# Patient Record
Sex: Male | Born: 1945 | Race: Black or African American | Hispanic: No | Marital: Married | Smoking: Former smoker
Health system: Southern US, Community
[De-identification: ages and names within clinical notes are randomized; demographics above are authoritative.]

## PROBLEM LIST (undated history)

## (undated) DIAGNOSIS — K219 Gastro-esophageal reflux disease without esophagitis: Secondary | ICD-10-CM

## (undated) DIAGNOSIS — R9431 Abnormal electrocardiogram [ECG] [EKG]: Secondary | ICD-10-CM

## (undated) DIAGNOSIS — H40051 Ocular hypertension, right eye: Secondary | ICD-10-CM

## (undated) DIAGNOSIS — E119 Type 2 diabetes mellitus without complications: Secondary | ICD-10-CM

## (undated) DIAGNOSIS — B351 Tinea unguium: Secondary | ICD-10-CM

## (undated) DIAGNOSIS — I10 Essential (primary) hypertension: Secondary | ICD-10-CM

## (undated) DIAGNOSIS — J309 Allergic rhinitis, unspecified: Secondary | ICD-10-CM

## (undated) DIAGNOSIS — D509 Iron deficiency anemia, unspecified: Secondary | ICD-10-CM

## (undated) DIAGNOSIS — N529 Male erectile dysfunction, unspecified: Secondary | ICD-10-CM

## (undated) DIAGNOSIS — H3321 Serous retinal detachment, right eye: Secondary | ICD-10-CM

## (undated) HISTORY — PX: OTHER SURGICAL HISTORY: SHX169

## (undated) HISTORY — DX: Serous retinal detachment, right eye: H33.21

## (undated) HISTORY — DX: Essential (primary) hypertension: I10

## (undated) HISTORY — DX: Abnormal electrocardiogram (ECG) (EKG): R94.31

## (undated) HISTORY — DX: Gastro-esophageal reflux disease without esophagitis: K21.9

## (undated) HISTORY — DX: Type 2 diabetes mellitus without complications: E11.9

## (undated) HISTORY — DX: Allergic rhinitis, unspecified: J30.9

## (undated) HISTORY — DX: Male erectile dysfunction, unspecified: N52.9

## (undated) HISTORY — PX: APPENDECTOMY: SHX54

## (undated) HISTORY — DX: Tinea unguium: B35.1

## (undated) HISTORY — DX: Ocular hypertension, right eye: H40.051

## (undated) HISTORY — DX: Iron deficiency anemia, unspecified: D50.9

## (undated) HISTORY — PX: CATARACT EXTRACTION: SUR2

---

## 1998-04-11 ENCOUNTER — Encounter: Admission: RE | Admit: 1998-04-11 | Discharge: 1998-07-10 | Payer: Self-pay | Admitting: *Deleted

## 2011-03-28 DIAGNOSIS — Z961 Presence of intraocular lens: Secondary | ICD-10-CM | POA: Insufficient documentation

## 2011-03-28 DIAGNOSIS — H35341 Macular cyst, hole, or pseudohole, right eye: Secondary | ICD-10-CM | POA: Insufficient documentation

## 2011-03-28 DIAGNOSIS — H33021 Retinal detachment with multiple breaks, right eye: Secondary | ICD-10-CM | POA: Insufficient documentation

## 2011-03-28 DIAGNOSIS — H33312 Horseshoe tear of retina without detachment, left eye: Secondary | ICD-10-CM | POA: Insufficient documentation

## 2011-12-20 DIAGNOSIS — I10 Essential (primary) hypertension: Secondary | ICD-10-CM | POA: Diagnosis not present

## 2011-12-20 DIAGNOSIS — Z0000 Encounter for general adult medical examination without abnormal findings: Secondary | ICD-10-CM | POA: Diagnosis not present

## 2011-12-20 DIAGNOSIS — E1142 Type 2 diabetes mellitus with diabetic polyneuropathy: Secondary | ICD-10-CM | POA: Diagnosis not present

## 2011-12-20 DIAGNOSIS — E1149 Type 2 diabetes mellitus with other diabetic neurological complication: Secondary | ICD-10-CM | POA: Diagnosis not present

## 2012-03-30 DIAGNOSIS — J019 Acute sinusitis, unspecified: Secondary | ICD-10-CM | POA: Diagnosis not present

## 2012-04-09 DIAGNOSIS — Z961 Presence of intraocular lens: Secondary | ICD-10-CM | POA: Diagnosis not present

## 2012-04-09 DIAGNOSIS — H33319 Horseshoe tear of retina without detachment, unspecified eye: Secondary | ICD-10-CM | POA: Diagnosis not present

## 2012-04-09 DIAGNOSIS — H33029 Retinal detachment with multiple breaks, unspecified eye: Secondary | ICD-10-CM | POA: Diagnosis not present

## 2012-04-09 DIAGNOSIS — H2510 Age-related nuclear cataract, unspecified eye: Secondary | ICD-10-CM | POA: Diagnosis not present

## 2012-04-09 DIAGNOSIS — H35349 Macular cyst, hole, or pseudohole, unspecified eye: Secondary | ICD-10-CM | POA: Diagnosis not present

## 2012-04-21 DIAGNOSIS — I10 Essential (primary) hypertension: Secondary | ICD-10-CM | POA: Diagnosis not present

## 2012-04-21 DIAGNOSIS — E1149 Type 2 diabetes mellitus with other diabetic neurological complication: Secondary | ICD-10-CM | POA: Diagnosis not present

## 2012-04-21 DIAGNOSIS — E1142 Type 2 diabetes mellitus with diabetic polyneuropathy: Secondary | ICD-10-CM | POA: Diagnosis not present

## 2012-05-04 DIAGNOSIS — E1149 Type 2 diabetes mellitus with other diabetic neurological complication: Secondary | ICD-10-CM | POA: Diagnosis not present

## 2012-08-25 DIAGNOSIS — I10 Essential (primary) hypertension: Secondary | ICD-10-CM | POA: Diagnosis not present

## 2012-08-25 DIAGNOSIS — E1142 Type 2 diabetes mellitus with diabetic polyneuropathy: Secondary | ICD-10-CM | POA: Diagnosis not present

## 2012-08-25 DIAGNOSIS — E1149 Type 2 diabetes mellitus with other diabetic neurological complication: Secondary | ICD-10-CM | POA: Diagnosis not present

## 2012-11-10 DIAGNOSIS — Z7189 Other specified counseling: Secondary | ICD-10-CM | POA: Diagnosis not present

## 2012-11-10 DIAGNOSIS — Z23 Encounter for immunization: Secondary | ICD-10-CM | POA: Diagnosis not present

## 2013-01-09 DIAGNOSIS — S60229A Contusion of unspecified hand, initial encounter: Secondary | ICD-10-CM | POA: Diagnosis not present

## 2013-01-09 DIAGNOSIS — S62319A Displaced fracture of base of unspecified metacarpal bone, initial encounter for closed fracture: Secondary | ICD-10-CM | POA: Diagnosis not present

## 2013-01-13 DIAGNOSIS — S62339A Displaced fracture of neck of unspecified metacarpal bone, initial encounter for closed fracture: Secondary | ICD-10-CM | POA: Diagnosis not present

## 2013-01-14 DIAGNOSIS — H35349 Macular cyst, hole, or pseudohole, unspecified eye: Secondary | ICD-10-CM | POA: Diagnosis not present

## 2013-01-14 DIAGNOSIS — H33029 Retinal detachment with multiple breaks, unspecified eye: Secondary | ICD-10-CM | POA: Diagnosis not present

## 2013-01-14 DIAGNOSIS — H33319 Horseshoe tear of retina without detachment, unspecified eye: Secondary | ICD-10-CM | POA: Diagnosis not present

## 2013-01-14 DIAGNOSIS — H2510 Age-related nuclear cataract, unspecified eye: Secondary | ICD-10-CM | POA: Diagnosis not present

## 2013-01-14 DIAGNOSIS — Z961 Presence of intraocular lens: Secondary | ICD-10-CM | POA: Diagnosis not present

## 2013-01-22 DIAGNOSIS — Z0000 Encounter for general adult medical examination without abnormal findings: Secondary | ICD-10-CM | POA: Diagnosis not present

## 2013-01-22 DIAGNOSIS — E1142 Type 2 diabetes mellitus with diabetic polyneuropathy: Secondary | ICD-10-CM | POA: Diagnosis not present

## 2013-01-22 DIAGNOSIS — Z1331 Encounter for screening for depression: Secondary | ICD-10-CM | POA: Diagnosis not present

## 2013-01-22 DIAGNOSIS — E1149 Type 2 diabetes mellitus with other diabetic neurological complication: Secondary | ICD-10-CM | POA: Diagnosis not present

## 2013-01-22 DIAGNOSIS — Z23 Encounter for immunization: Secondary | ICD-10-CM | POA: Diagnosis not present

## 2013-01-22 DIAGNOSIS — I10 Essential (primary) hypertension: Secondary | ICD-10-CM | POA: Diagnosis not present

## 2013-01-22 DIAGNOSIS — Z125 Encounter for screening for malignant neoplasm of prostate: Secondary | ICD-10-CM | POA: Diagnosis not present

## 2013-02-03 DIAGNOSIS — S62339A Displaced fracture of neck of unspecified metacarpal bone, initial encounter for closed fracture: Secondary | ICD-10-CM | POA: Diagnosis not present

## 2013-03-29 ENCOUNTER — Ambulatory Visit: Payer: Self-pay | Admitting: Podiatry

## 2013-04-26 ENCOUNTER — Ambulatory Visit: Payer: Self-pay | Admitting: Podiatry

## 2013-05-25 DIAGNOSIS — I10 Essential (primary) hypertension: Secondary | ICD-10-CM | POA: Diagnosis not present

## 2013-05-25 DIAGNOSIS — E1149 Type 2 diabetes mellitus with other diabetic neurological complication: Secondary | ICD-10-CM | POA: Diagnosis not present

## 2013-05-25 DIAGNOSIS — E1142 Type 2 diabetes mellitus with diabetic polyneuropathy: Secondary | ICD-10-CM | POA: Diagnosis not present

## 2013-09-27 DIAGNOSIS — I10 Essential (primary) hypertension: Secondary | ICD-10-CM | POA: Diagnosis not present

## 2013-09-27 DIAGNOSIS — E1142 Type 2 diabetes mellitus with diabetic polyneuropathy: Secondary | ICD-10-CM | POA: Diagnosis not present

## 2013-09-27 DIAGNOSIS — Z23 Encounter for immunization: Secondary | ICD-10-CM | POA: Diagnosis not present

## 2013-09-27 DIAGNOSIS — E1149 Type 2 diabetes mellitus with other diabetic neurological complication: Secondary | ICD-10-CM | POA: Diagnosis not present

## 2013-11-23 DIAGNOSIS — S161XXA Strain of muscle, fascia and tendon at neck level, initial encounter: Secondary | ICD-10-CM | POA: Diagnosis not present

## 2013-11-23 DIAGNOSIS — T148 Other injury of unspecified body region: Secondary | ICD-10-CM | POA: Diagnosis not present

## 2013-12-21 DIAGNOSIS — E1142 Type 2 diabetes mellitus with diabetic polyneuropathy: Secondary | ICD-10-CM | POA: Diagnosis not present

## 2013-12-21 DIAGNOSIS — M25562 Pain in left knee: Secondary | ICD-10-CM | POA: Diagnosis not present

## 2013-12-21 DIAGNOSIS — M25551 Pain in right hip: Secondary | ICD-10-CM | POA: Diagnosis not present

## 2013-12-21 DIAGNOSIS — I10 Essential (primary) hypertension: Secondary | ICD-10-CM | POA: Diagnosis not present

## 2013-12-22 ENCOUNTER — Other Ambulatory Visit: Payer: Self-pay | Admitting: Internal Medicine

## 2013-12-22 ENCOUNTER — Ambulatory Visit
Admission: RE | Admit: 2013-12-22 | Discharge: 2013-12-22 | Disposition: A | Discharge status: Home / Self Care | Payer: Medicare Other | Source: Ambulatory Visit | Attending: Internal Medicine | Admitting: Internal Medicine

## 2013-12-22 DIAGNOSIS — M25551 Pain in right hip: Secondary | ICD-10-CM

## 2013-12-22 DIAGNOSIS — M1712 Unilateral primary osteoarthritis, left knee: Secondary | ICD-10-CM | POA: Diagnosis not present

## 2013-12-23 DIAGNOSIS — Z961 Presence of intraocular lens: Secondary | ICD-10-CM | POA: Diagnosis not present

## 2013-12-23 DIAGNOSIS — H35341 Macular cyst, hole, or pseudohole, right eye: Secondary | ICD-10-CM | POA: Diagnosis not present

## 2013-12-23 DIAGNOSIS — H33021 Retinal detachment with multiple breaks, right eye: Secondary | ICD-10-CM | POA: Diagnosis not present

## 2013-12-23 DIAGNOSIS — H33312 Horseshoe tear of retina without detachment, left eye: Secondary | ICD-10-CM | POA: Diagnosis not present

## 2013-12-23 DIAGNOSIS — H2512 Age-related nuclear cataract, left eye: Secondary | ICD-10-CM | POA: Diagnosis not present

## 2014-04-26 DIAGNOSIS — E1142 Type 2 diabetes mellitus with diabetic polyneuropathy: Secondary | ICD-10-CM | POA: Diagnosis not present

## 2014-04-26 DIAGNOSIS — I10 Essential (primary) hypertension: Secondary | ICD-10-CM | POA: Diagnosis not present

## 2014-09-06 DIAGNOSIS — E1142 Type 2 diabetes mellitus with diabetic polyneuropathy: Secondary | ICD-10-CM | POA: Diagnosis not present

## 2014-09-06 DIAGNOSIS — Z23 Encounter for immunization: Secondary | ICD-10-CM | POA: Diagnosis not present

## 2014-09-06 DIAGNOSIS — Z794 Long term (current) use of insulin: Secondary | ICD-10-CM | POA: Diagnosis not present

## 2014-09-06 DIAGNOSIS — I10 Essential (primary) hypertension: Secondary | ICD-10-CM | POA: Diagnosis not present

## 2014-10-27 DIAGNOSIS — H35341 Macular cyst, hole, or pseudohole, right eye: Secondary | ICD-10-CM | POA: Diagnosis not present

## 2014-10-27 DIAGNOSIS — Z961 Presence of intraocular lens: Secondary | ICD-10-CM | POA: Diagnosis not present

## 2014-10-27 DIAGNOSIS — H33021 Retinal detachment with multiple breaks, right eye: Secondary | ICD-10-CM | POA: Diagnosis not present

## 2014-10-27 DIAGNOSIS — H2512 Age-related nuclear cataract, left eye: Secondary | ICD-10-CM | POA: Diagnosis not present

## 2014-10-27 DIAGNOSIS — H33312 Horseshoe tear of retina without detachment, left eye: Secondary | ICD-10-CM | POA: Diagnosis not present

## 2015-01-26 DIAGNOSIS — E1165 Type 2 diabetes mellitus with hyperglycemia: Secondary | ICD-10-CM | POA: Diagnosis not present

## 2015-01-26 DIAGNOSIS — Z125 Encounter for screening for malignant neoplasm of prostate: Secondary | ICD-10-CM | POA: Diagnosis not present

## 2015-01-26 DIAGNOSIS — Z1389 Encounter for screening for other disorder: Secondary | ICD-10-CM | POA: Diagnosis not present

## 2015-01-26 DIAGNOSIS — Z794 Long term (current) use of insulin: Secondary | ICD-10-CM | POA: Diagnosis not present

## 2015-01-26 DIAGNOSIS — E0842 Diabetes mellitus due to underlying condition with diabetic polyneuropathy: Secondary | ICD-10-CM | POA: Diagnosis not present

## 2015-01-26 DIAGNOSIS — Z7984 Long term (current) use of oral hypoglycemic drugs: Secondary | ICD-10-CM | POA: Diagnosis not present

## 2015-01-26 DIAGNOSIS — Z0000 Encounter for general adult medical examination without abnormal findings: Secondary | ICD-10-CM | POA: Diagnosis not present

## 2015-01-26 DIAGNOSIS — I10 Essential (primary) hypertension: Secondary | ICD-10-CM | POA: Diagnosis not present

## 2015-02-04 IMAGING — CR DG KNEE 1-2V*L*
2 series · 2 of 2 positions shown · non-contrast
Comparison: None.

CLINICAL DATA: Medial knee pain for several weeks.

EXAM:
LEFT KNEE - 1-2 VIEW

[t knee ap left]
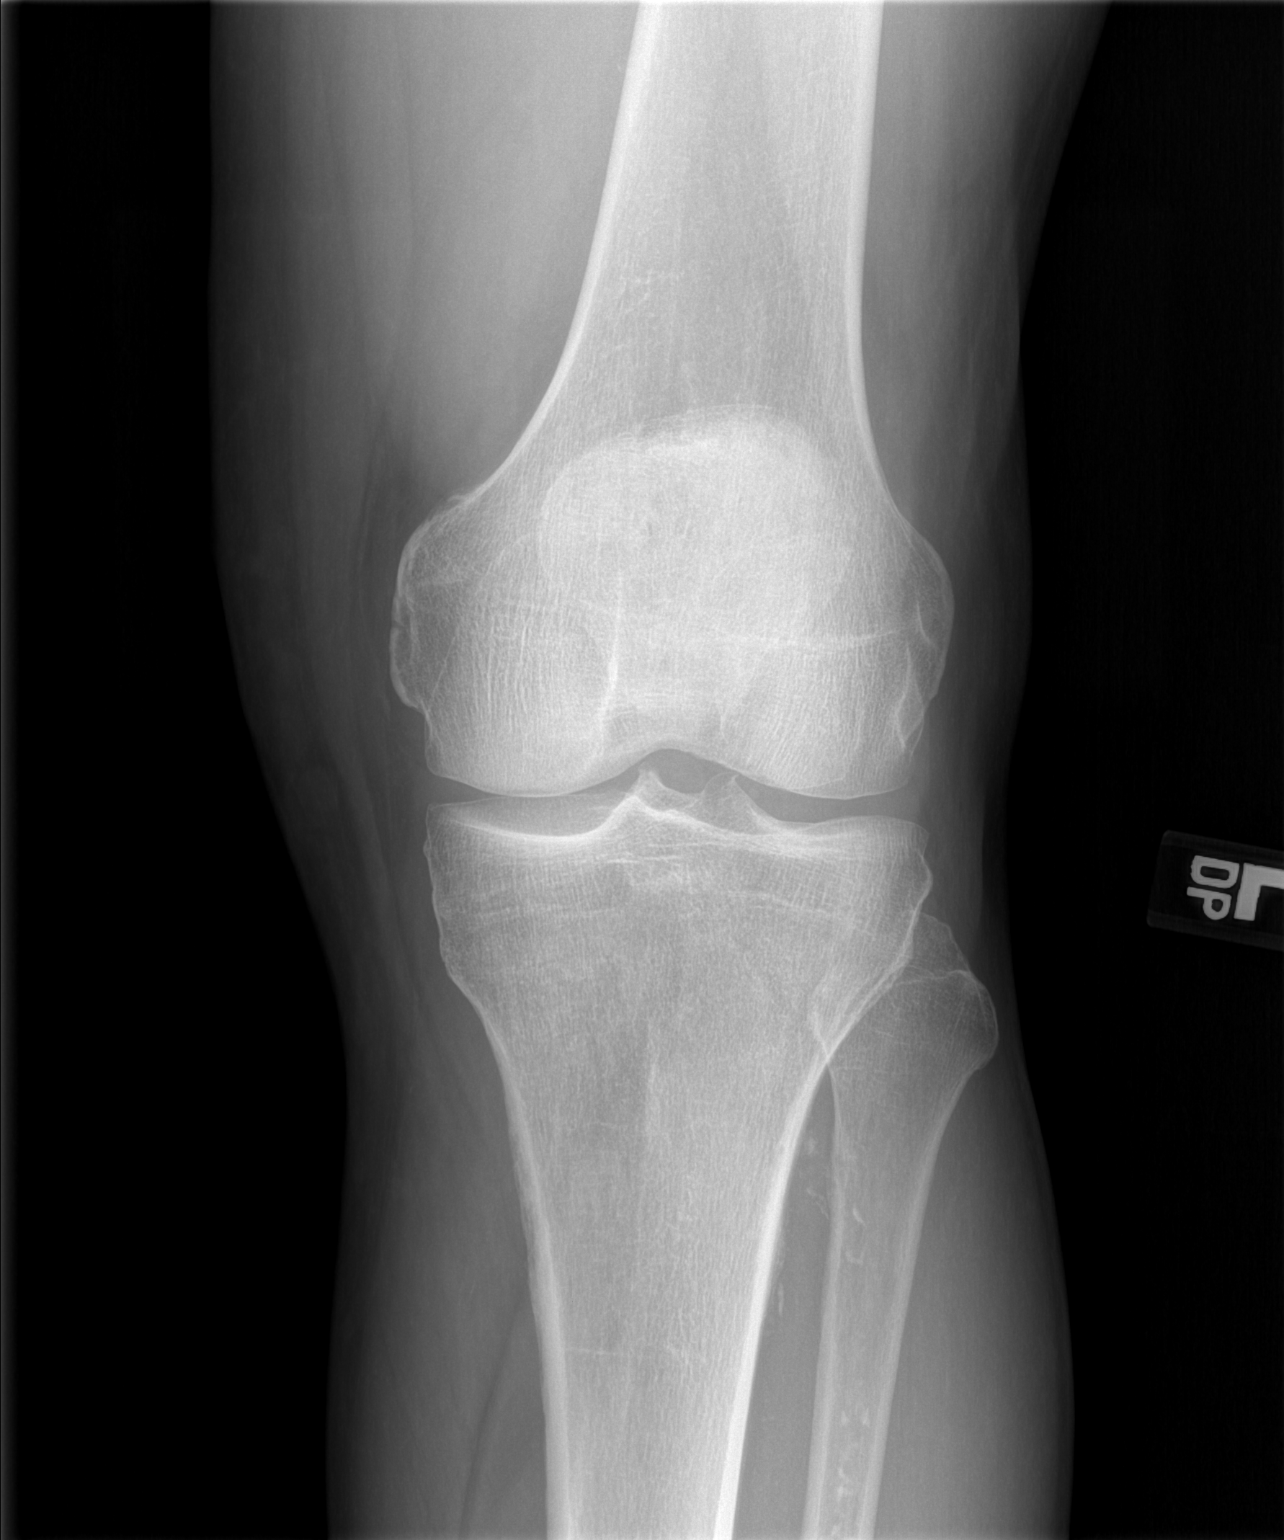

[t knee lat left]
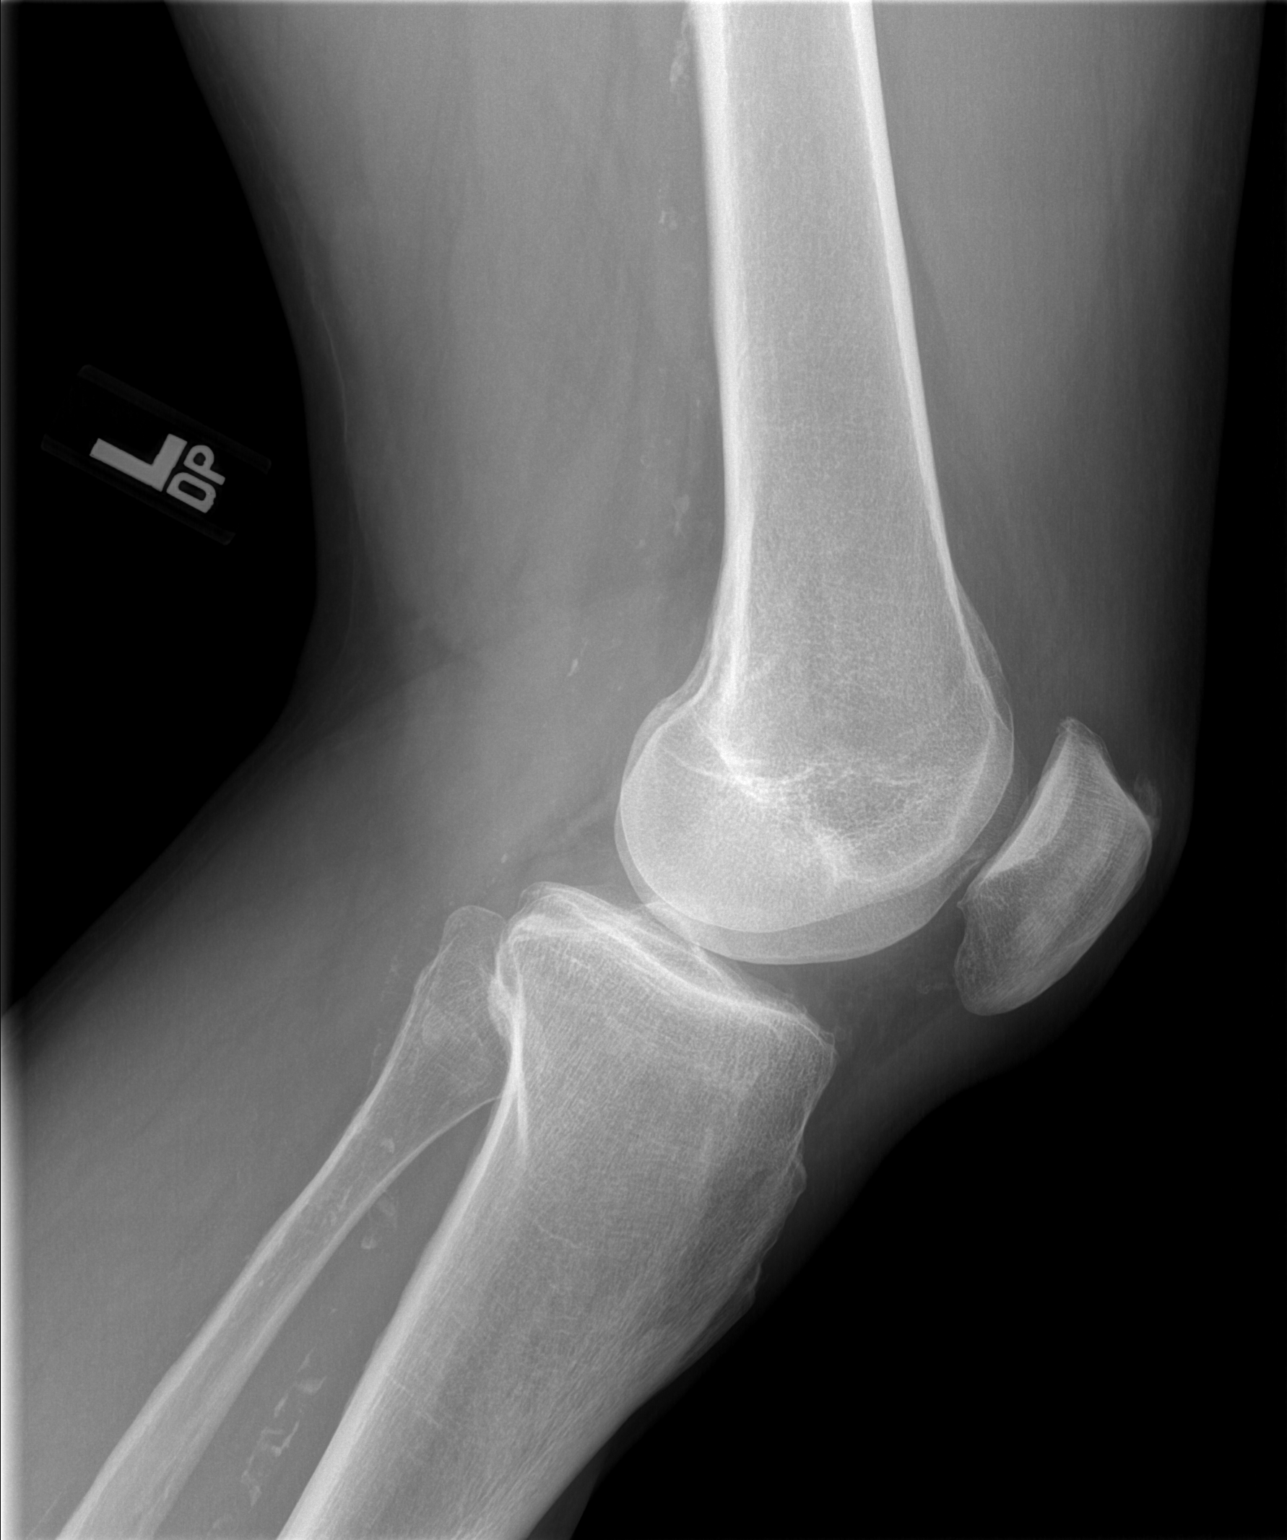

[2 of 2 positions shown; findings below may reference images not displayed]

FINDINGS: Early spurring within the patellofemoral compartment. Joint spaces
relatively maintained. No acute bony abnormality. Specifically, no
fracture, subluxation, or dislocation. Soft tissues are intact. No
joint effusion. Scattered vascular calcifications.
IMPRESSION: Early degenerative changes.  No acute findings.

## 2015-02-04 IMAGING — CR DG HIP COMPLETE 2+V*R*
2 series · 2 of 2 positions shown · non-contrast
Comparison: None.

CLINICAL DATA: Pain for 1 week.  No known injury.

EXAM:
RIGHT HIP - COMPLETE 2+ VIEW

[t hip ap right]
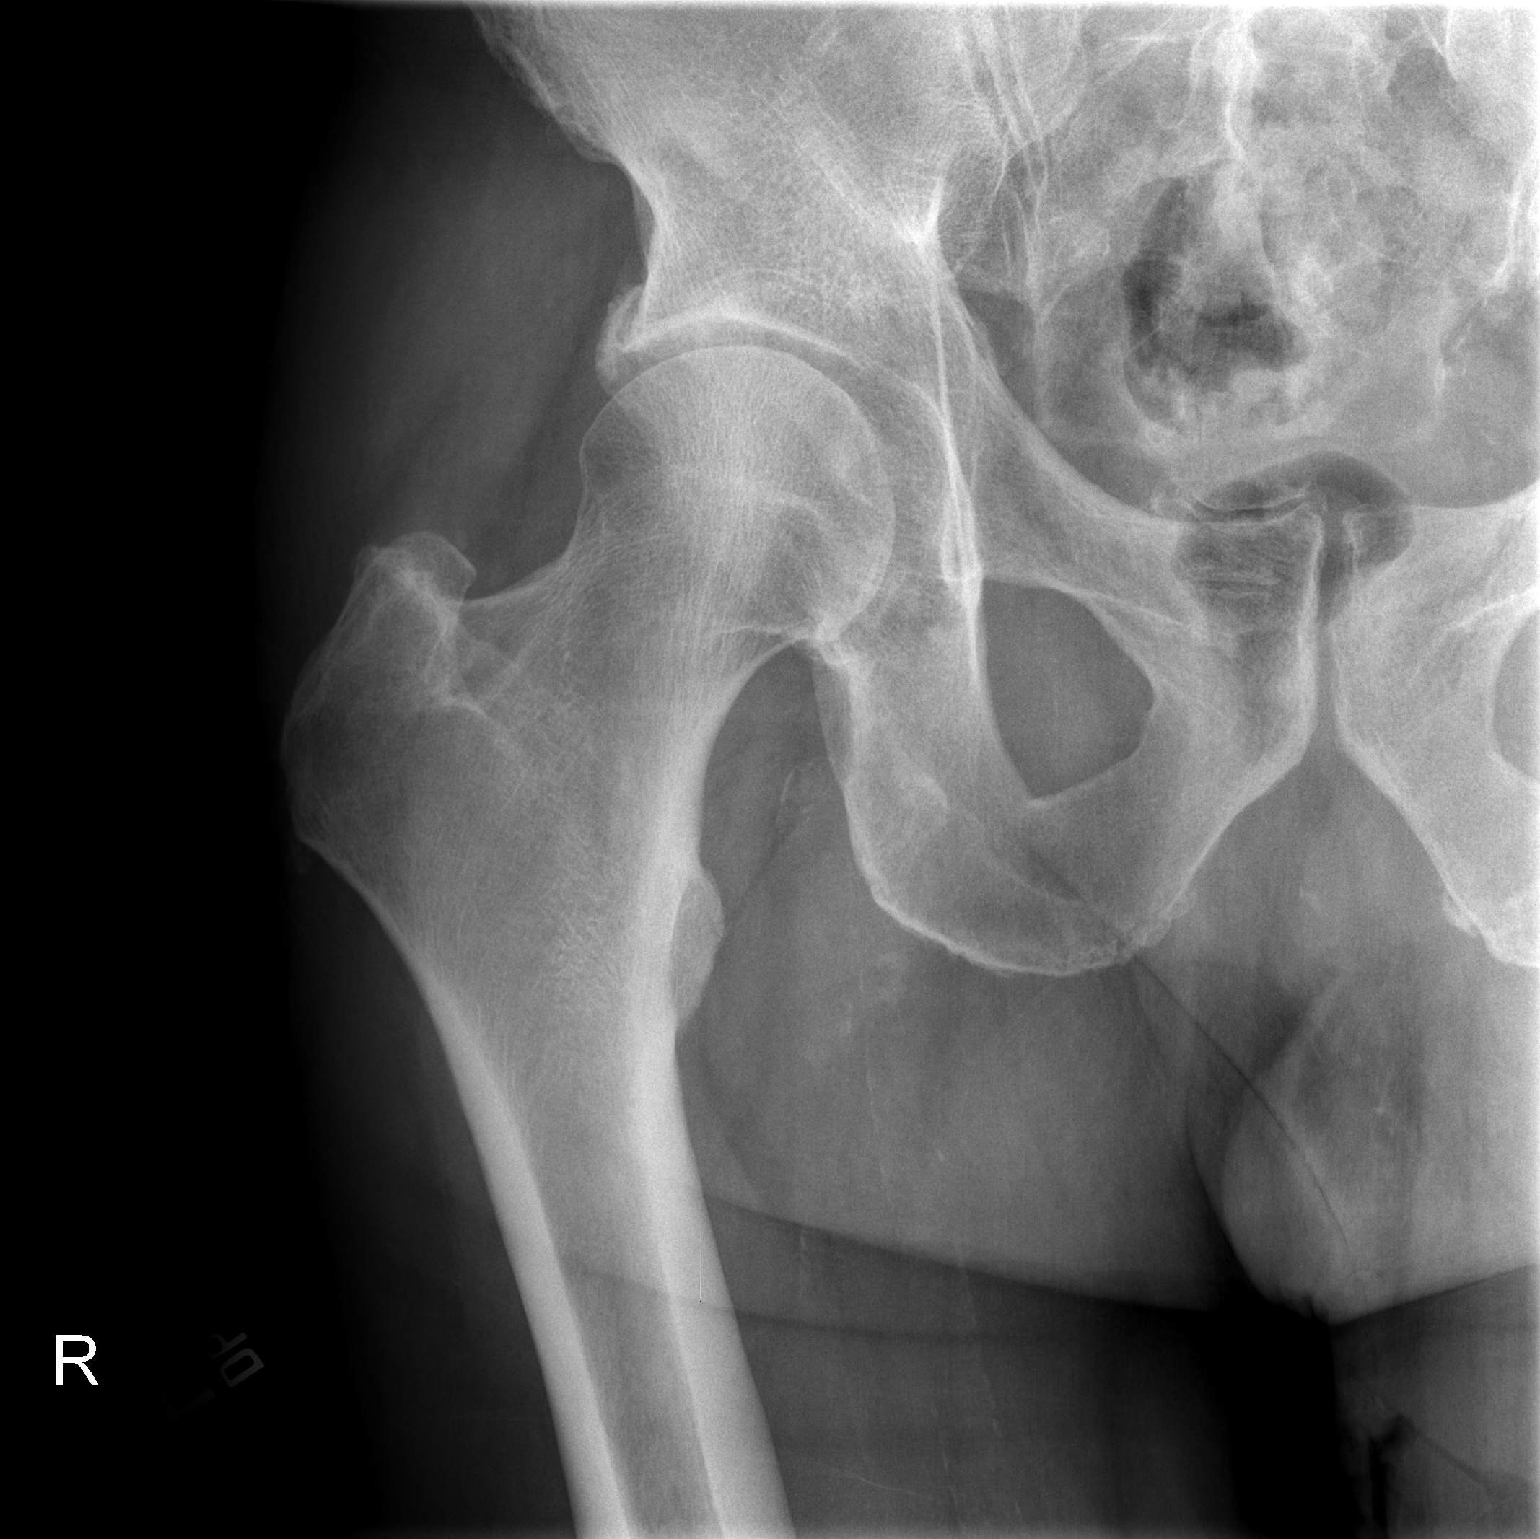

[t hip frog leg right]
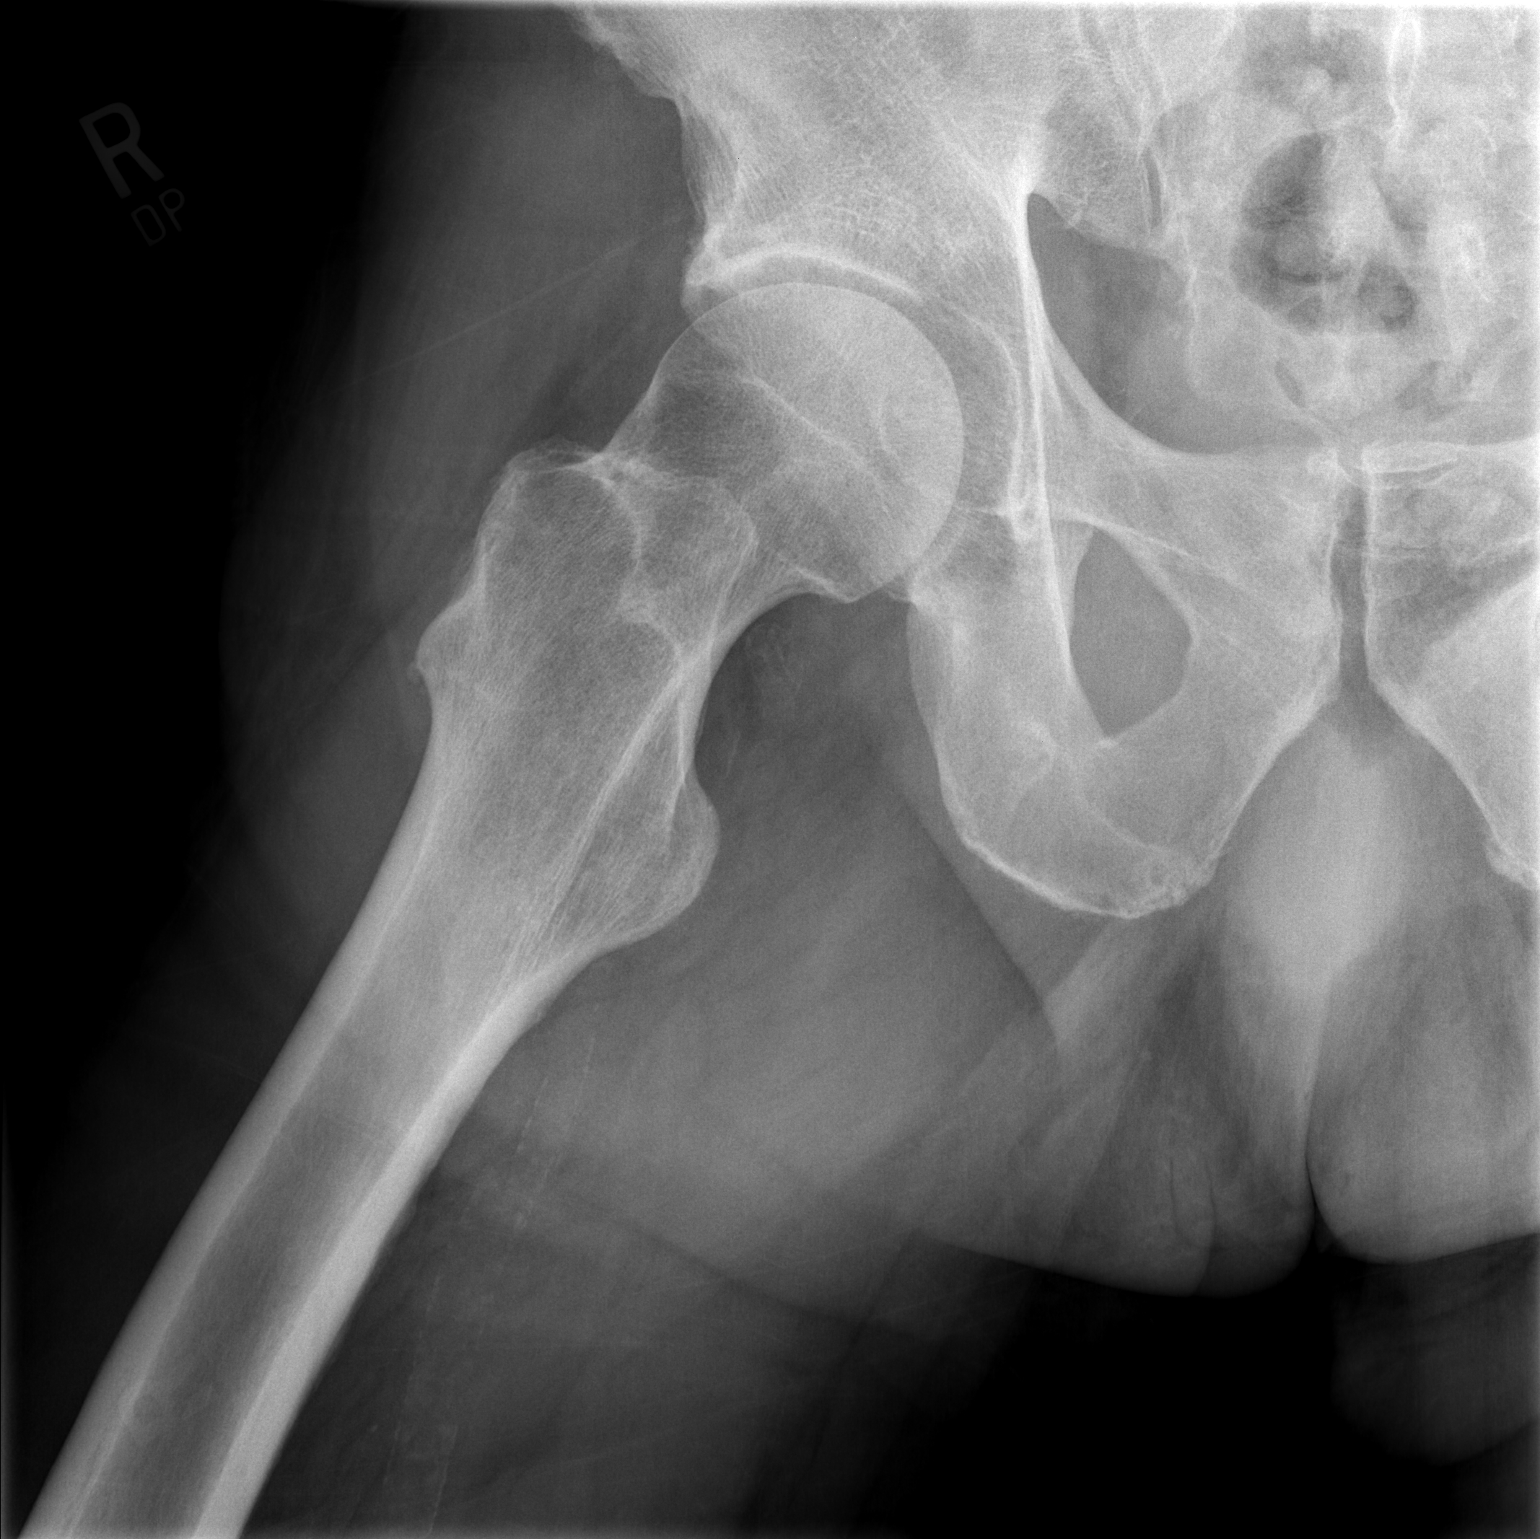

[2 of 2 positions shown; findings below may reference images not displayed]

FINDINGS: Early spurring along the superior acetabulum. Joint space is
maintained. No acute bony abnormality. Specifically, no fracture,
subluxation, or dislocation. Soft tissues are intact.
IMPRESSION: No acute bony abnormality.

## 2015-05-25 DIAGNOSIS — H401312 Pigmentary glaucoma, right eye, moderate stage: Secondary | ICD-10-CM | POA: Diagnosis not present

## 2015-05-25 DIAGNOSIS — H2512 Age-related nuclear cataract, left eye: Secondary | ICD-10-CM | POA: Diagnosis not present

## 2015-05-25 DIAGNOSIS — H35341 Macular cyst, hole, or pseudohole, right eye: Secondary | ICD-10-CM | POA: Diagnosis not present

## 2015-05-25 DIAGNOSIS — H33021 Retinal detachment with multiple breaks, right eye: Secondary | ICD-10-CM | POA: Diagnosis not present

## 2015-05-25 DIAGNOSIS — H33312 Horseshoe tear of retina without detachment, left eye: Secondary | ICD-10-CM | POA: Diagnosis not present

## 2015-05-25 DIAGNOSIS — Z961 Presence of intraocular lens: Secondary | ICD-10-CM | POA: Diagnosis not present

## 2015-05-26 DIAGNOSIS — I10 Essential (primary) hypertension: Secondary | ICD-10-CM | POA: Diagnosis not present

## 2015-05-26 DIAGNOSIS — Z7984 Long term (current) use of oral hypoglycemic drugs: Secondary | ICD-10-CM | POA: Diagnosis not present

## 2015-05-26 DIAGNOSIS — E1165 Type 2 diabetes mellitus with hyperglycemia: Secondary | ICD-10-CM | POA: Diagnosis not present

## 2015-05-26 DIAGNOSIS — E0842 Diabetes mellitus due to underlying condition with diabetic polyneuropathy: Secondary | ICD-10-CM | POA: Diagnosis not present

## 2015-10-03 DIAGNOSIS — Z23 Encounter for immunization: Secondary | ICD-10-CM | POA: Diagnosis not present

## 2015-10-03 DIAGNOSIS — I10 Essential (primary) hypertension: Secondary | ICD-10-CM | POA: Diagnosis not present

## 2015-10-03 DIAGNOSIS — M5412 Radiculopathy, cervical region: Secondary | ICD-10-CM | POA: Diagnosis not present

## 2015-10-03 DIAGNOSIS — E119 Type 2 diabetes mellitus without complications: Secondary | ICD-10-CM | POA: Diagnosis not present

## 2015-10-03 DIAGNOSIS — Z7984 Long term (current) use of oral hypoglycemic drugs: Secondary | ICD-10-CM | POA: Diagnosis not present

## 2015-12-21 DIAGNOSIS — H33312 Horseshoe tear of retina without detachment, left eye: Secondary | ICD-10-CM | POA: Diagnosis not present

## 2015-12-21 DIAGNOSIS — H25812 Combined forms of age-related cataract, left eye: Secondary | ICD-10-CM | POA: Insufficient documentation

## 2015-12-21 DIAGNOSIS — H33021 Retinal detachment with multiple breaks, right eye: Secondary | ICD-10-CM | POA: Diagnosis not present

## 2015-12-21 DIAGNOSIS — Z961 Presence of intraocular lens: Secondary | ICD-10-CM | POA: Diagnosis not present

## 2015-12-21 DIAGNOSIS — H35341 Macular cyst, hole, or pseudohole, right eye: Secondary | ICD-10-CM | POA: Diagnosis not present

## 2016-02-06 DIAGNOSIS — I10 Essential (primary) hypertension: Secondary | ICD-10-CM | POA: Diagnosis not present

## 2016-02-06 DIAGNOSIS — E119 Type 2 diabetes mellitus without complications: Secondary | ICD-10-CM | POA: Diagnosis not present

## 2016-02-06 DIAGNOSIS — Z7984 Long term (current) use of oral hypoglycemic drugs: Secondary | ICD-10-CM | POA: Diagnosis not present

## 2016-02-06 DIAGNOSIS — Z0000 Encounter for general adult medical examination without abnormal findings: Secondary | ICD-10-CM | POA: Diagnosis not present

## 2016-02-06 DIAGNOSIS — Z1389 Encounter for screening for other disorder: Secondary | ICD-10-CM | POA: Diagnosis not present

## 2016-06-07 DIAGNOSIS — R400 Somnolence: Secondary | ICD-10-CM | POA: Diagnosis not present

## 2016-06-07 DIAGNOSIS — Z7984 Long term (current) use of oral hypoglycemic drugs: Secondary | ICD-10-CM | POA: Diagnosis not present

## 2016-06-07 DIAGNOSIS — E119 Type 2 diabetes mellitus without complications: Secondary | ICD-10-CM | POA: Diagnosis not present

## 2016-06-07 DIAGNOSIS — I10 Essential (primary) hypertension: Secondary | ICD-10-CM | POA: Diagnosis not present

## 2016-06-07 DIAGNOSIS — E1165 Type 2 diabetes mellitus with hyperglycemia: Secondary | ICD-10-CM | POA: Diagnosis not present

## 2016-10-16 DIAGNOSIS — E1142 Type 2 diabetes mellitus with diabetic polyneuropathy: Secondary | ICD-10-CM | POA: Diagnosis not present

## 2016-10-16 DIAGNOSIS — I10 Essential (primary) hypertension: Secondary | ICD-10-CM | POA: Diagnosis not present

## 2016-10-16 DIAGNOSIS — D509 Iron deficiency anemia, unspecified: Secondary | ICD-10-CM | POA: Diagnosis not present

## 2016-10-18 DIAGNOSIS — E0842 Diabetes mellitus due to underlying condition with diabetic polyneuropathy: Secondary | ICD-10-CM | POA: Diagnosis not present

## 2016-10-18 DIAGNOSIS — E1065 Type 1 diabetes mellitus with hyperglycemia: Secondary | ICD-10-CM | POA: Diagnosis not present

## 2016-10-18 DIAGNOSIS — E1165 Type 2 diabetes mellitus with hyperglycemia: Secondary | ICD-10-CM | POA: Diagnosis not present

## 2016-10-18 DIAGNOSIS — I10 Essential (primary) hypertension: Secondary | ICD-10-CM | POA: Diagnosis not present

## 2016-10-18 DIAGNOSIS — Z7984 Long term (current) use of oral hypoglycemic drugs: Secondary | ICD-10-CM | POA: Diagnosis not present

## 2016-10-23 DIAGNOSIS — I10 Essential (primary) hypertension: Secondary | ICD-10-CM | POA: Diagnosis not present

## 2016-10-23 DIAGNOSIS — E1165 Type 2 diabetes mellitus with hyperglycemia: Secondary | ICD-10-CM | POA: Diagnosis not present

## 2016-10-23 DIAGNOSIS — Z7984 Long term (current) use of oral hypoglycemic drugs: Secondary | ICD-10-CM | POA: Diagnosis not present

## 2016-10-23 DIAGNOSIS — E0842 Diabetes mellitus due to underlying condition with diabetic polyneuropathy: Secondary | ICD-10-CM | POA: Diagnosis not present

## 2016-11-07 DIAGNOSIS — H33021 Retinal detachment with multiple breaks, right eye: Secondary | ICD-10-CM | POA: Diagnosis not present

## 2016-11-07 DIAGNOSIS — H25812 Combined forms of age-related cataract, left eye: Secondary | ICD-10-CM | POA: Diagnosis not present

## 2016-11-07 DIAGNOSIS — H35341 Macular cyst, hole, or pseudohole, right eye: Secondary | ICD-10-CM | POA: Diagnosis not present

## 2016-11-07 DIAGNOSIS — H33312 Horseshoe tear of retina without detachment, left eye: Secondary | ICD-10-CM | POA: Diagnosis not present

## 2016-11-07 DIAGNOSIS — Z961 Presence of intraocular lens: Secondary | ICD-10-CM | POA: Diagnosis not present

## 2016-11-15 DIAGNOSIS — E1142 Type 2 diabetes mellitus with diabetic polyneuropathy: Secondary | ICD-10-CM | POA: Diagnosis not present

## 2016-11-15 DIAGNOSIS — Z7984 Long term (current) use of oral hypoglycemic drugs: Secondary | ICD-10-CM | POA: Diagnosis not present

## 2016-11-15 DIAGNOSIS — Z23 Encounter for immunization: Secondary | ICD-10-CM | POA: Diagnosis not present

## 2017-02-03 DIAGNOSIS — Z7984 Long term (current) use of oral hypoglycemic drugs: Secondary | ICD-10-CM | POA: Diagnosis not present

## 2017-02-03 DIAGNOSIS — E1142 Type 2 diabetes mellitus with diabetic polyneuropathy: Secondary | ICD-10-CM | POA: Diagnosis not present

## 2017-02-03 DIAGNOSIS — Z1389 Encounter for screening for other disorder: Secondary | ICD-10-CM | POA: Diagnosis not present

## 2017-02-03 DIAGNOSIS — I10 Essential (primary) hypertension: Secondary | ICD-10-CM | POA: Diagnosis not present

## 2017-02-03 DIAGNOSIS — Z0000 Encounter for general adult medical examination without abnormal findings: Secondary | ICD-10-CM | POA: Diagnosis not present

## 2017-02-03 DIAGNOSIS — Z794 Long term (current) use of insulin: Secondary | ICD-10-CM | POA: Diagnosis not present

## 2017-02-04 DIAGNOSIS — E1142 Type 2 diabetes mellitus with diabetic polyneuropathy: Secondary | ICD-10-CM | POA: Diagnosis not present

## 2017-02-04 DIAGNOSIS — I10 Essential (primary) hypertension: Secondary | ICD-10-CM | POA: Diagnosis not present

## 2017-02-04 DIAGNOSIS — D509 Iron deficiency anemia, unspecified: Secondary | ICD-10-CM | POA: Diagnosis not present

## 2017-02-04 DIAGNOSIS — Z794 Long term (current) use of insulin: Secondary | ICD-10-CM | POA: Diagnosis not present

## 2017-02-11 DIAGNOSIS — M5412 Radiculopathy, cervical region: Secondary | ICD-10-CM | POA: Diagnosis not present

## 2017-02-19 ENCOUNTER — Ambulatory Visit (INDEPENDENT_AMBULATORY_CARE_PROVIDER_SITE_OTHER): Payer: Medicare Other | Admitting: Podiatry

## 2017-02-19 ENCOUNTER — Encounter: Payer: Self-pay | Admitting: Podiatry

## 2017-02-19 VITALS — BP 160/97 | HR 77 | Resp 16

## 2017-02-19 DIAGNOSIS — B351 Tinea unguium: Secondary | ICD-10-CM | POA: Diagnosis not present

## 2017-02-19 NOTE — Progress Notes (Signed)
   Subjective:    Patient ID: Blake BalesWilliam L Sen, male    DOB: July 12, 1945, 72 y.o.   MRN: [id number]  HPI    Review of Systems  All other systems reviewed and are negative.      Objective:   Physical Exam        Assessment & Plan:

## 2017-02-20 NOTE — Progress Notes (Signed)
Subjective:   Patient ID: Blake Johnston, male   DOB: 72 y.o.   MRN: [id number]   HPI Patient presents concerned about discoloration of the left and right big toenail and states he has had it for a long time and years ago was on antifungal therapy.  Patient does not smoke and likes to be active   Review of Systems  All other systems reviewed and are negative.       Objective:  Physical Exam  Constitutional: He appears well-developed and well-nourished.  Cardiovascular: Intact distal pulses.  Pulmonary/Chest: Effort normal.  Musculoskeletal: Normal range of motion.  Neurological: He is alert.  Skin: Skin is warm.  Nursing note and vitals reviewed.   Neurovascular status found to be intact muscle strength was adequate range of motion within normal limits with deep discoloration of the hallux nailbed bilateral that is localized with no indications of proximal spread.  Patient does have mild incurvation but no pain associated with this     Assessment:  Probability for a mycotic nail infection that has been treated previously without success     Plan:  H&P condition reviewed and discussed that this is a difficult problem and that there is no simple correction.  I have recommended that he continue local care and if symptoms were to become painful that we will need to consider nail removal of which I educated him today

## 2017-02-26 DIAGNOSIS — Z794 Long term (current) use of insulin: Secondary | ICD-10-CM | POA: Diagnosis not present

## 2017-02-26 DIAGNOSIS — E1142 Type 2 diabetes mellitus with diabetic polyneuropathy: Secondary | ICD-10-CM | POA: Diagnosis not present

## 2017-02-26 DIAGNOSIS — I10 Essential (primary) hypertension: Secondary | ICD-10-CM | POA: Diagnosis not present

## 2017-02-26 DIAGNOSIS — D509 Iron deficiency anemia, unspecified: Secondary | ICD-10-CM | POA: Diagnosis not present

## 2017-05-22 DIAGNOSIS — E1142 Type 2 diabetes mellitus with diabetic polyneuropathy: Secondary | ICD-10-CM | POA: Diagnosis not present

## 2017-05-22 DIAGNOSIS — R5383 Other fatigue: Secondary | ICD-10-CM | POA: Diagnosis not present

## 2017-05-23 DIAGNOSIS — D509 Iron deficiency anemia, unspecified: Secondary | ICD-10-CM | POA: Diagnosis not present

## 2017-06-03 DIAGNOSIS — I10 Essential (primary) hypertension: Secondary | ICD-10-CM | POA: Diagnosis not present

## 2017-06-03 DIAGNOSIS — E1142 Type 2 diabetes mellitus with diabetic polyneuropathy: Secondary | ICD-10-CM | POA: Diagnosis not present

## 2017-06-03 DIAGNOSIS — Z794 Long term (current) use of insulin: Secondary | ICD-10-CM | POA: Diagnosis not present

## 2017-09-18 DIAGNOSIS — Z961 Presence of intraocular lens: Secondary | ICD-10-CM | POA: Diagnosis not present

## 2017-09-18 DIAGNOSIS — H35341 Macular cyst, hole, or pseudohole, right eye: Secondary | ICD-10-CM | POA: Diagnosis not present

## 2017-09-18 DIAGNOSIS — H33312 Horseshoe tear of retina without detachment, left eye: Secondary | ICD-10-CM | POA: Diagnosis not present

## 2017-09-18 DIAGNOSIS — H40151 Residual stage of open-angle glaucoma, right eye: Secondary | ICD-10-CM | POA: Diagnosis not present

## 2017-09-18 DIAGNOSIS — H33021 Retinal detachment with multiple breaks, right eye: Secondary | ICD-10-CM | POA: Diagnosis not present

## 2017-09-18 DIAGNOSIS — H25812 Combined forms of age-related cataract, left eye: Secondary | ICD-10-CM | POA: Diagnosis not present

## 2017-10-02 DIAGNOSIS — R9431 Abnormal electrocardiogram [ECG] [EKG]: Secondary | ICD-10-CM | POA: Diagnosis not present

## 2017-10-02 DIAGNOSIS — Z794 Long term (current) use of insulin: Secondary | ICD-10-CM | POA: Diagnosis not present

## 2017-10-02 DIAGNOSIS — Z23 Encounter for immunization: Secondary | ICD-10-CM | POA: Diagnosis not present

## 2017-10-02 DIAGNOSIS — I10 Essential (primary) hypertension: Secondary | ICD-10-CM | POA: Diagnosis not present

## 2017-10-02 DIAGNOSIS — E1142 Type 2 diabetes mellitus with diabetic polyneuropathy: Secondary | ICD-10-CM | POA: Diagnosis not present

## 2017-10-03 ENCOUNTER — Other Ambulatory Visit (HOSPITAL_COMMUNITY): Payer: Self-pay | Admitting: Internal Medicine

## 2017-10-03 DIAGNOSIS — R9431 Abnormal electrocardiogram [ECG] [EKG]: Secondary | ICD-10-CM

## 2017-10-06 ENCOUNTER — Other Ambulatory Visit (HOSPITAL_COMMUNITY): Payer: No Typology Code available for payment source

## 2017-10-07 ENCOUNTER — Ambulatory Visit (HOSPITAL_COMMUNITY): Payer: Medicare Other

## 2017-10-10 NOTE — Progress Notes (Signed)
Beryle Flock, MD Reason for referral-abnormal electrocardiogram/bradycardia  HPI: 72 year old male for evaluation of abnormal electrocardiogram/bradycardia at request of Kirby Funk, MD. Echocardiogram October 2019 showed normal LV function, mild diastolic dysfunction, mildly dilated aortic root (ascending aorta also measured at 4.3 cm), mild mitral regurgitation.  Patient denies dyspnea on exertion, orthopnea, PND, pedal edema, chest pain or syncope.  Current Outpatient Medications  Medication Sig Dispense Refill  . amLODipine (NORVASC) 10 MG tablet     . aspirin EC 81 MG tablet Take 81 mg by mouth daily.    . carvedilol (COREG) 25 MG tablet     . losartan (COZAAR) 100 MG tablet     . metFORMIN (GLUCOPHAGE) 1000 MG tablet     . Omega-3 Fatty Acids (FISH OIL) 1000 MG CAPS Take by mouth.    . TRESIBA FLEXTOUCH 100 UNIT/ML SOPN FlexTouch Pen     . TRULICITY 1.5 MG/0.5ML SOPN     . vitamin B-12 (CYANOCOBALAMIN) 100 MCG tablet Take 100 mcg by mouth daily.     No current facility-administered medications for this visit.     Allergies  Allergen Reactions  . Ace Inhibitors Cough     Past Medical History:  Diagnosis Date  . Abnormal EKG   . Allergic rhinitis   . Detached retina, right   . DM (diabetes mellitus) (HCC)   . ED (erectile dysfunction)    FAILED MEDS AND INJECTIONS  . Elevated IOP, right    EYE  . Esophageal reflux   . Hypertension   . Microcytic anemia   . Onychomycosis     Past Surgical History:  Procedure Laterality Date  . APPENDECTOMY    . CATARACT EXTRACTION    . RETINAL PUCKLER REPAIR      Social History   Socioeconomic History  . Marital status: Married    Spouse name: Not on file  . Number of children: 5  . Years of education: Not on file  . Highest education level: Not on file  Occupational History  . Not on file  Social Needs  . Financial resource strain: Not on file  . Food insecurity:    Worry: Not on file    Inability:  Not on file  . Transportation needs:    Medical: Not on file    Non-medical: Not on file  Tobacco Use  . Smoking status: Former Smoker    Packs/day: 2.00    Last attempt to quit: 10/16/1965    Years since quitting: 52.0  . Smokeless tobacco: Never Used  Substance and Sexual Activity  . Alcohol use: Never    Frequency: Never  . Drug use: Never  . Sexual activity: Not on file    Comment: MARRIED  Lifestyle  . Physical activity:    Days per week: Not on file    Minutes per session: Not on file  . Stress: Not on file  Relationships  . Social connections:    Talks on phone: Not on file    Gets together: Not on file    Attends religious service: Not on file    Active member of club or organization: Not on file    Attends meetings of clubs or organizations: Not on file    Relationship status: Not on file  . Intimate partner violence:    Fear of current or ex partner: Not on file    Emotionally abused: Not on file    Physically abused: Not on file    Forced sexual activity:  Not on file  Other Topics Concern  . Not on file  Social History Narrative  . Not on file    Family History  Problem Relation Age of Onset  . Diabetes Mother   . Bleeding Disorder Father   . Cancer Sister        OVARIAN  . Cancer Brother        LIVER  . Cancer Brother        STOMACH  . Cirrhosis Brother   . Sudden death Sister     ROS: no fevers or chills, productive cough, hemoptysis, dysphasia, odynophagia, melena, hematochezia, dysuria, hematuria, rash, seizure activity, orthopnea, PND, pedal edema, claudication. Remaining systems are negative.  Physical Exam:   Blood pressure (!) 158/96, pulse 63, height 6\' 3"  (1.905 m), weight 234 lb (106.1 kg).  General:  Well developed/well nourished in NAD Skin warm/dry Patient not depressed No peripheral clubbing Back-normal HEENT-normal/normal eyelids Neck supple/normal carotid upstroke bilaterally; no bruits; no JVD; no thyromegaly chest - CTA/  normal expansion CV - RRR/normal S1 and S2; no murmurs, rubs or gallops;  PMI nondisplaced Abdomen -NT/ND, no HSM, no mass, + bowel sounds, positive bruit 2+ femoral pulses, no bruits Ext-no edema, chords, 2+ DP Neuro-grossly nonfocal  ECG -sinus rhythm with Mobitz 1 second-degree AV block, septal infarct, inferior infarct, lateral T wave inversion.  Personally reviewed  Electrocardiogram from October 02, 2017 showed sinus rhythm with Mobitz 1 second-degree AV block, septal infarct and inferior infarct.  Lateral T wave inversion.  A/P  1 abnormal ECG-patient is noted to have possible septal and inferior infarct on ECG.  However recent echocardiogram showed normal LV function.  When I see him back I will likely arrange a stress nuclear study for risk stratification particularly in light of diabetes mellitus.  2 bradycardia-patient with Mobitz 1 second-degree AV block on ECG.  No symptoms.  Decrease carvedilol to 12.5 mg twice daily.  We will plan follow-up in 6 to 8 weeks and if it persists we will reduce carvedilol further and potentially discontinue.  3 hypertension-blood pressure is elevated and I am also reducing dose of carvedilol.  Add HCTZ 12.5 mg daily and follow.  Check potassium and renal function in 1 week.  4 thoracic aortic aneurysm-arrange CTA to further assess.  Also with history of tobacco use at an early age.  We will scan abdominal aorta as well.  Olga Millers, MD

## 2017-10-15 ENCOUNTER — Ambulatory Visit (HOSPITAL_COMMUNITY): Payer: Medicare Other | Attending: Cardiovascular Disease

## 2017-10-15 ENCOUNTER — Other Ambulatory Visit: Payer: Self-pay

## 2017-10-15 DIAGNOSIS — R9431 Abnormal electrocardiogram [ECG] [EKG]: Secondary | ICD-10-CM | POA: Insufficient documentation

## 2017-10-17 ENCOUNTER — Encounter: Payer: Self-pay | Admitting: Cardiology

## 2017-10-17 ENCOUNTER — Ambulatory Visit (INDEPENDENT_AMBULATORY_CARE_PROVIDER_SITE_OTHER): Payer: Medicare Other | Admitting: Cardiology

## 2017-10-17 VITALS — BP 158/96 | HR 63 | Ht 75.0 in | Wt 234.0 lb

## 2017-10-17 DIAGNOSIS — R0989 Other specified symptoms and signs involving the circulatory and respiratory systems: Secondary | ICD-10-CM

## 2017-10-17 DIAGNOSIS — I712 Thoracic aortic aneurysm, without rupture, unspecified: Secondary | ICD-10-CM

## 2017-10-17 DIAGNOSIS — R9431 Abnormal electrocardiogram [ECG] [EKG]: Secondary | ICD-10-CM

## 2017-10-17 MED ORDER — HYDROCHLOROTHIAZIDE 12.5 MG PO CAPS: 12.5000 mg | ORAL_CAPSULE | Freq: Every day | ORAL | 3 refills | Status: DC

## 2017-10-17 MED ORDER — CARVEDILOL 25 MG PO TABS: 12.5000 mg | ORAL_TABLET | Freq: Two times a day (BID) | ORAL | Status: DC

## 2017-10-17 NOTE — Patient Instructions (Signed)
Medication Instructions:  DECREASE CARVEDILOL TO 12.5 MG TWICE DAILY= 1/2 OF THE 25 MG TABLET TWICE DAILY  START HCTZ 12.5 MG ONCE DAILY If you need a refill on your cardiac medications before your next appointment, please call your pharmacy.   Lab work: Your physician recommends that you return for lab work in: ONE WEEK If you have labs (blood work) drawn today and your tests are completely normal, you will receive your results only by: Marland Kitchen MyChart Message (if you have MyChart) OR . A paper copy in the mail If you have any lab test that is abnormal or we need to change your treatment, we will call you to review the results.  Testing/Procedures: CTA OF THE CHEST AND ABDOMEN TO FOLLOW UP ON THORACIC ANEURYSM AND ABDOMINAL BRUIT AT THE [address] LOCATION=DO NOT TAKE METFORMIN THE DAY OF THE SCAN OR 48 HOURS AFTER THE SCAN=SCHEDULE IN 2 WEEKS  Follow-Up: Your physician recommends that you schedule a follow-up appointment in: 6-8 WEEKS WITH DR Jens Som

## 2017-10-24 DIAGNOSIS — I712 Thoracic aortic aneurysm, without rupture: Secondary | ICD-10-CM | POA: Diagnosis not present

## 2017-10-25 LAB — BASIC METABOLIC PANEL
BUN / CREAT RATIO: 12 (ref 10–24)
BUN: 11 mg/dL (ref 8–27)
CHLORIDE: 94 mmol/L — AB (ref 96–106)
CO2: 27 mmol/L (ref 20–29)
CREATININE: 0.92 mg/dL (ref 0.76–1.27)
Calcium: 9.7 mg/dL (ref 8.6–10.2)
GFR calc Af Amer: 97 mL/min/{1.73_m2} (ref >59)
GFR calc non Af Amer: 84 mL/min/{1.73_m2} (ref >59)
GLUCOSE: 211 mg/dL — AB (ref 65–99)
Potassium: 4.2 mmol/L (ref 3.5–5.2)
SODIUM: 136 mmol/L (ref 134–144)

## 2017-10-27 ENCOUNTER — Encounter: Payer: Self-pay | Admitting: *Deleted

## 2017-10-29 DIAGNOSIS — I10 Essential (primary) hypertension: Secondary | ICD-10-CM | POA: Diagnosis not present

## 2017-10-29 DIAGNOSIS — D509 Iron deficiency anemia, unspecified: Secondary | ICD-10-CM | POA: Diagnosis not present

## 2017-10-29 DIAGNOSIS — E1142 Type 2 diabetes mellitus with diabetic polyneuropathy: Secondary | ICD-10-CM | POA: Diagnosis not present

## 2017-11-10 ENCOUNTER — Other Ambulatory Visit: Payer: Medicare Other

## 2017-11-10 ENCOUNTER — Inpatient Hospital Stay: Admission: RE | Admit: 2017-11-10 | Payer: Medicare Other | Source: Ambulatory Visit

## 2017-11-17 ENCOUNTER — Encounter: Payer: Self-pay | Admitting: Cardiology

## 2017-11-19 DIAGNOSIS — Z7984 Long term (current) use of oral hypoglycemic drugs: Secondary | ICD-10-CM | POA: Diagnosis not present

## 2017-11-19 DIAGNOSIS — D509 Iron deficiency anemia, unspecified: Secondary | ICD-10-CM | POA: Diagnosis not present

## 2017-11-19 DIAGNOSIS — E1142 Type 2 diabetes mellitus with diabetic polyneuropathy: Secondary | ICD-10-CM | POA: Diagnosis not present

## 2017-11-19 DIAGNOSIS — I10 Essential (primary) hypertension: Secondary | ICD-10-CM | POA: Diagnosis not present

## 2017-11-26 NOTE — Progress Notes (Signed)
HPI: FU bradycardia. Echocardiogram October 2019 showed normal LV function, mild diastolic dysfunction, mildly dilated aortic root (ascending aorta also measured at 4.3 cm), mild mitral regurgitation.    CTA of thoracic and abdominal November 2019 is scheduled for 12/16.  Patient noted to have Mobitz 1 second-degree AV block at time of last office visit.  Carvedilol was reduced.  HCTZ was added.  Since last seen, the patient denies any dyspnea on exertion, orthopnea, PND, pedal edema, palpitations, syncope or chest pain.   Current Outpatient Medications  Medication Sig Dispense Refill  . amLODipine (NORVASC) 10 MG tablet     . aspirin EC 81 MG tablet Take 81 mg by mouth daily.    . carvedilol (COREG) 25 MG tablet Take 0.5 tablets (12.5 mg total) by mouth 2 (two) times daily with a meal.    . hydrochlorothiazide (MICROZIDE) 12.5 MG capsule Take 1 capsule (12.5 mg total) by mouth daily. 90 capsule 3  . metFORMIN (GLUCOPHAGE) 1000 MG tablet Take 1,000 mg by mouth 2 (two) times daily with a meal.     . Omega-3 Fatty Acids (FISH OIL) 1000 MG CAPS Take by mouth.    . TRESIBA FLEXTOUCH 100 UNIT/ML SOPN FlexTouch Pen     . TRULICITY 1.5 MG/0.5ML SOPN once a week.     . vitamin B-12 (CYANOCOBALAMIN) 100 MCG tablet Take 100 mcg by mouth daily.     No current facility-administered medications for this visit.      Past Medical History:  Diagnosis Date  . Abnormal EKG   . Allergic rhinitis   . Detached retina, right   . DM (diabetes mellitus) (HCC)   . ED (erectile dysfunction)    FAILED MEDS AND INJECTIONS  . Elevated IOP, right    EYE  . Esophageal reflux   . Hypertension   . Microcytic anemia   . Onychomycosis     Past Surgical History:  Procedure Laterality Date  . APPENDECTOMY    . CATARACT EXTRACTION    . RETINAL PUCKLER REPAIR      Social History   Socioeconomic History  . Marital status: Married    Spouse name: Not on file  . Number of children: 5  . Years of  education: Not on file  . Highest education level: Not on file  Occupational History  . Not on file  Social Needs  . Financial resource strain: Not on file  . Food insecurity:    Worry: Not on file    Inability: Not on file  . Transportation needs:    Medical: Not on file    Non-medical: Not on file  Tobacco Use  . Smoking status: Former Smoker    Packs/day: 2.00    Last attempt to quit: 10/16/1965    Years since quitting: 52.1  . Smokeless tobacco: Never Used  Substance and Sexual Activity  . Alcohol use: Never    Frequency: Never  . Drug use: Never  . Sexual activity: Not on file    Comment: MARRIED  Lifestyle  . Physical activity:    Days per week: Not on file    Minutes per session: Not on file  . Stress: Not on file  Relationships  . Social connections:    Talks on phone: Not on file    Gets together: Not on file    Attends religious service: Not on file    Active member of club or organization: Not on file    Attends meetings  of clubs or organizations: Not on file    Relationship status: Not on file  . Intimate partner violence:    Fear of current or ex partner: Not on file    Emotionally abused: Not on file    Physically abused: Not on file    Forced sexual activity: Not on file  Other Topics Concern  . Not on file  Social History Narrative  . Not on file    Family History  Problem Relation Age of Onset  . Diabetes Mother   . Bleeding Disorder Father   . Cancer Sister        OVARIAN  . Cancer Brother        LIVER  . Cancer Brother        STOMACH  . Cirrhosis Brother   . Sudden death Sister     ROS: no fevers or chills, productive cough, hemoptysis, dysphasia, odynophagia, melena, hematochezia, dysuria, hematuria, rash, seizure activity, orthopnea, PND, pedal edema, claudication. Remaining systems are negative.  Physical Exam: Well-developed well-nourished in no acute distress.  Skin is warm and dry.  HEENT is normal.  Neck is supple.  Chest  is clear to auscultation with normal expansion.  Cardiovascular exam is regular rate and rhythm.  Abdominal exam nontender or distended. No masses palpated. Extremities show no edema. neuro grossly intact  ECG-sinus rhythm with type I second-degree AV block, septal infarct, lateral T wave inversion.  Cannot rule out inferior infarct.  Personally reviewed  A/P  1 abnormal electrocardiogram-possible septal and inferior infarct on ECG.  Previous echocardiogram showed normal wall motion.  I will arrange a stress echocardiogram to screen for ischemia particularly in light of long history of diabetes mellitus.  2 bradycardia-evidence of Mobitz 1 intermittently.  Decrease carvedilol to 6.25 mg twice daily and follow.  3 thoracic aortic aneurysm-patient is scheduled for CTA to exclude thoracic and abdominal aortic aneurysm.  4 hypertension-blood pressure is borderline and we are decreasing his carvedilol.  He will follow his blood pressure at home and we will advance regimen as needed.  Olga MillersBrian , MD

## 2017-11-28 ENCOUNTER — Telehealth: Payer: Self-pay

## 2017-11-28 DIAGNOSIS — Z79899 Other long term (current) drug therapy: Secondary | ICD-10-CM

## 2017-11-28 NOTE — Telephone Encounter (Signed)
Stacy called fom CT and states thaty pt had a BMET order for his upcoming CT

## 2017-12-01 ENCOUNTER — Other Ambulatory Visit: Payer: Medicare Other

## 2017-12-08 ENCOUNTER — Ambulatory Visit (INDEPENDENT_AMBULATORY_CARE_PROVIDER_SITE_OTHER): Payer: Medicare Other | Admitting: Cardiology

## 2017-12-08 ENCOUNTER — Encounter: Payer: Self-pay | Admitting: Cardiology

## 2017-12-08 VITALS — BP 140/74 | HR 44 | Ht 75.0 in | Wt 239.0 lb

## 2017-12-08 DIAGNOSIS — R001 Bradycardia, unspecified: Secondary | ICD-10-CM

## 2017-12-08 DIAGNOSIS — R9431 Abnormal electrocardiogram [ECG] [EKG]: Secondary | ICD-10-CM | POA: Diagnosis not present

## 2017-12-08 DIAGNOSIS — I10 Essential (primary) hypertension: Secondary | ICD-10-CM

## 2017-12-08 MED ORDER — CARVEDILOL 6.25 MG PO TABS: 6.2500 mg | ORAL_TABLET | Freq: Two times a day (BID) | ORAL | 3 refills | Status: DC

## 2017-12-08 NOTE — Patient Instructions (Signed)
Medication Instructions:  DECREASE CARVEDILOL TO 6.25 MG TWICE DAILY If you need a refill on your cardiac medications before your next appointment, please call your pharmacy.   Lab work: If you have labs (blood work) drawn today and your tests are completely normal, you will receive your results only by: Marland Kitchen. MyChart Message (if you have MyChart) OR . A paper copy in the mail If you have any lab test that is abnormal or we need to change your treatment, we will call you to review the results.  Testing/Procedures: Your physician has requested that you have a stress echocardiogram. For further information please visit [email or web address]. Please follow instruction sheet as given.    Follow-Up: Your physician recommends that you schedule a follow-up appointment in: 12 WEEKS WITH DR Jens SomRENSHAW

## 2017-12-15 ENCOUNTER — Telehealth (HOSPITAL_COMMUNITY): Payer: Self-pay | Admitting: *Deleted

## 2017-12-15 NOTE — Telephone Encounter (Signed)
Left message on voicemail in reference to upcoming appointment scheduled for 12/17/17. Phone number given for a call back so details instructions can be given. Blake DolinSharon S Brooks

## 2017-12-17 ENCOUNTER — Other Ambulatory Visit (HOSPITAL_COMMUNITY): Payer: Medicare Other

## 2017-12-22 ENCOUNTER — Other Ambulatory Visit: Payer: Medicare Other

## 2017-12-22 ENCOUNTER — Inpatient Hospital Stay: Admission: RE | Admit: 2017-12-22 | Payer: Medicare Other | Source: Ambulatory Visit

## 2017-12-24 ENCOUNTER — Other Ambulatory Visit (HOSPITAL_COMMUNITY): Payer: Medicare Other

## 2017-12-25 ENCOUNTER — Other Ambulatory Visit (HOSPITAL_COMMUNITY): Payer: Medicare Other

## 2017-12-26 ENCOUNTER — Telehealth (HOSPITAL_COMMUNITY): Payer: Self-pay | Admitting: *Deleted

## 2017-12-26 ENCOUNTER — Other Ambulatory Visit (HOSPITAL_COMMUNITY): Payer: Medicare Other

## 2017-12-26 NOTE — Telephone Encounter (Signed)
Patient given detailed instructions per Stress Test Requisition Sheet for test on 12/30/17 at 2:00. Patient Notified to arrive 30 minutes early, and that it is imperative to arrive on time for appointment to keep from having the test rescheduled.  Patient verbalized understanding. Blake DolinSharon S Brooks

## 2017-12-29 ENCOUNTER — Ambulatory Visit (HOSPITAL_COMMUNITY): Payer: Medicare Other | Attending: Cardiovascular Disease

## 2017-12-29 ENCOUNTER — Ambulatory Visit (HOSPITAL_COMMUNITY): Payer: Medicare Other

## 2017-12-29 ENCOUNTER — Other Ambulatory Visit: Payer: Self-pay

## 2017-12-29 DIAGNOSIS — R9431 Abnormal electrocardiogram [ECG] [EKG]: Secondary | ICD-10-CM | POA: Diagnosis not present

## 2018-01-06 DIAGNOSIS — D509 Iron deficiency anemia, unspecified: Secondary | ICD-10-CM | POA: Diagnosis not present

## 2018-01-06 DIAGNOSIS — I10 Essential (primary) hypertension: Secondary | ICD-10-CM | POA: Diagnosis not present

## 2018-01-06 DIAGNOSIS — E1142 Type 2 diabetes mellitus with diabetic polyneuropathy: Secondary | ICD-10-CM | POA: Diagnosis not present

## 2018-01-08 ENCOUNTER — Other Ambulatory Visit (HOSPITAL_COMMUNITY): Payer: Medicare Other

## 2018-02-06 DIAGNOSIS — Z794 Long term (current) use of insulin: Secondary | ICD-10-CM | POA: Diagnosis not present

## 2018-02-06 DIAGNOSIS — Z0000 Encounter for general adult medical examination without abnormal findings: Secondary | ICD-10-CM | POA: Diagnosis not present

## 2018-02-06 DIAGNOSIS — I441 Atrioventricular block, second degree: Secondary | ICD-10-CM | POA: Diagnosis not present

## 2018-02-06 DIAGNOSIS — Z1389 Encounter for screening for other disorder: Secondary | ICD-10-CM | POA: Diagnosis not present

## 2018-02-06 DIAGNOSIS — I10 Essential (primary) hypertension: Secondary | ICD-10-CM | POA: Diagnosis not present

## 2018-02-06 DIAGNOSIS — E1142 Type 2 diabetes mellitus with diabetic polyneuropathy: Secondary | ICD-10-CM | POA: Diagnosis not present

## 2018-03-03 DIAGNOSIS — E1142 Type 2 diabetes mellitus with diabetic polyneuropathy: Secondary | ICD-10-CM | POA: Diagnosis not present

## 2018-03-03 DIAGNOSIS — I10 Essential (primary) hypertension: Secondary | ICD-10-CM | POA: Diagnosis not present

## 2018-03-03 DIAGNOSIS — D509 Iron deficiency anemia, unspecified: Secondary | ICD-10-CM | POA: Diagnosis not present

## 2018-03-03 DIAGNOSIS — Z7984 Long term (current) use of oral hypoglycemic drugs: Secondary | ICD-10-CM | POA: Diagnosis not present

## 2018-03-09 NOTE — Progress Notes (Signed)
HPI: FU bradycardia. Echocardiogram October 2019 showed normal LV function, mild diastolic dysfunction, mildly dilated aortic root (ascending aorta also measured at 4.3 cm), mild mitral regurgitation. Patient noted to have Mobitz 1 second-degree AV block at time of last office visit.  Carvedilol was reduced.  HCTZ was added.    Stress echocardiogram December 2019 negative.  CTA of thoracic aorta scheduled at last office visit but not performed.  Since last seen,  patient denies dyspnea, chest pain, palpitations or syncope.  Current Outpatient Medications  Medication Sig Dispense Refill  . amLODipine (NORVASC) 10 MG tablet     . aspirin EC 81 MG tablet Take 81 mg by mouth daily.    . carvedilol (COREG) 6.25 MG tablet Take 1 tablet (6.25 mg total) by mouth 2 (two) times daily with a meal. 180 tablet 3  . hydrochlorothiazide (MICROZIDE) 12.5 MG capsule Take 1 capsule (12.5 mg total) by mouth daily. 90 capsule 3  . metFORMIN (GLUCOPHAGE) 1000 MG tablet Take 1,000 mg by mouth 2 (two) times daily with a meal.     . Omega-3 Fatty Acids (FISH OIL) 1000 MG CAPS Take by mouth.    . TRESIBA FLEXTOUCH 100 UNIT/ML SOPN FlexTouch Pen     . TRULICITY 1.5 MG/0.5ML SOPN once a week.     . vitamin B-12 (CYANOCOBALAMIN) 100 MCG tablet Take 100 mcg by mouth daily.     No current facility-administered medications for this visit.      Past Medical History:  Diagnosis Date  . Abnormal EKG   . Allergic rhinitis   . Detached retina, right   . DM (diabetes mellitus) (HCC)   . ED (erectile dysfunction)    FAILED MEDS AND INJECTIONS  . Elevated IOP, right    EYE  . Esophageal reflux   . Hypertension   . Microcytic anemia   . Onychomycosis     Past Surgical History:  Procedure Laterality Date  . APPENDECTOMY    . CATARACT EXTRACTION    . RETINAL PUCKLER REPAIR      Social History   Socioeconomic History  . Marital status: Married    Spouse name: Not on file  . Number of children: 5  .  Years of education: Not on file  . Highest education level: Not on file  Occupational History  . Not on file  Social Needs  . Financial resource strain: Not on file  . Food insecurity:    Worry: Not on file    Inability: Not on file  . Transportation needs:    Medical: Not on file    Non-medical: Not on file  Tobacco Use  . Smoking status: Former Smoker    Packs/day: 2.00    Last attempt to quit: 10/16/1965    Years since quitting: 52.4  . Smokeless tobacco: Never Used  Substance and Sexual Activity  . Alcohol use: Never    Frequency: Never  . Drug use: Never  . Sexual activity: Not on file    Comment: MARRIED  Lifestyle  . Physical activity:    Days per week: Not on file    Minutes per session: Not on file  . Stress: Not on file  Relationships  . Social connections:    Talks on phone: Not on file    Gets together: Not on file    Attends religious service: Not on file    Active member of club or organization: Not on file    Attends meetings of  clubs or organizations: Not on file    Relationship status: Not on file  . Intimate partner violence:    Fear of current or ex partner: Not on file    Emotionally abused: Not on file    Physically abused: Not on file    Forced sexual activity: Not on file  Other Topics Concern  . Not on file  Social History Narrative  . Not on file    Family History  Problem Relation Age of Onset  . Diabetes Mother   . Bleeding Disorder Father   . Cancer Sister        OVARIAN  . Cancer Brother        LIVER  . Cancer Brother        STOMACH  . Cirrhosis Brother   . Sudden death Sister     ROS: no fevers or chills, productive cough, hemoptysis, dysphasia, odynophagia, melena, hematochezia, dysuria, hematuria, rash, seizure activity, orthopnea, PND, pedal edema, claudication. Remaining systems are negative.  Physical Exam: Well-developed well-nourished in no acute distress.  Skin is warm and dry.  HEENT is normal.  Neck is supple.   Chest is clear to auscultation with normal expansion.  Cardiovascular exam is regular rate and rhythm.  Abdominal exam nontender or distended. No masses palpated. Extremities show no edema. neuro grossly intact  A/P  1 abnormal electrocardiogram-previous ECG showed possible septal and inferior infarct.  However echocardiogram showed normal wall motion.  Stress echocardiogram was negative.  2 bradycardia-previous evidence of Mobitz 1 on ECG.  Carvedilol was decreased.  No symptoms of presyncope or syncope.  3 thoracic aortic aneurysm-previous CTA ordered but not performed.  We will reschedule.  4 hypertension-patient's blood pressure is controlled.  Continue present medications and follow.  Olga Millers, MD

## 2018-03-13 ENCOUNTER — Encounter: Payer: Self-pay | Admitting: Cardiology

## 2018-03-13 ENCOUNTER — Ambulatory Visit (INDEPENDENT_AMBULATORY_CARE_PROVIDER_SITE_OTHER): Payer: Medicare Other | Admitting: Cardiology

## 2018-03-13 VITALS — BP 126/76 | HR 74 | Ht 75.0 in | Wt 232.0 lb

## 2018-03-13 DIAGNOSIS — R001 Bradycardia, unspecified: Secondary | ICD-10-CM | POA: Diagnosis not present

## 2018-03-13 DIAGNOSIS — I712 Thoracic aortic aneurysm, without rupture, unspecified: Secondary | ICD-10-CM

## 2018-03-13 DIAGNOSIS — I10 Essential (primary) hypertension: Secondary | ICD-10-CM | POA: Diagnosis not present

## 2018-03-13 LAB — BASIC METABOLIC PANEL
BUN / CREAT RATIO: 16 (ref 10–24)
BUN: 13 mg/dL (ref 8–27)
CO2: 24 mmol/L (ref 20–29)
Calcium: 9.3 mg/dL (ref 8.6–10.2)
Chloride: 97 mmol/L (ref 96–106)
Creatinine, Ser: 0.8 mg/dL (ref 0.76–1.27)
GFR calc non Af Amer: 90 mL/min/{1.73_m2} (ref >59)
GFR, EST AFRICAN AMERICAN: 104 mL/min/{1.73_m2} (ref >59)
Glucose: 162 mg/dL — ABNORMAL HIGH (ref 65–99)
Potassium: 4.3 mmol/L (ref 3.5–5.2)
Sodium: 137 mmol/L (ref 134–144)

## 2018-03-13 NOTE — Patient Instructions (Signed)
Medication Instructions:  NO CHANGE If you need a refill on your cardiac medications before your next appointment, please call your pharmacy.   Lab work: If you have labs (blood work) drawn today and your tests are completely normal, you will receive your results only by: Marland Kitchen MyChart Message (if you have MyChart) OR . A paper copy in the mail If you have any lab test that is abnormal or we need to change your treatment, we will call you to review the results.  Testing/Procedures: CTA OF THE CHEST W/WO TO FOLLOW UP THORACIC ANEURYSM AT THE [place] LOCATION-[address]  Follow-Up: At [facility], you and your health needs are our priority.  As part of our continuing mission to provide you with exceptional heart care, we have created designated Provider Care Teams.  These Care Teams include your primary Cardiologist (physician) and Advanced Practice Providers (APPs -  Physician Assistants and Nurse Practitioners) who all work together to provide you with the care you need, when you need it. Your physician recommends that you schedule a follow-up appointment in: ONE YEAR WITH DR CRENSHAW IN [place]

## 2018-03-16 ENCOUNTER — Encounter: Payer: Self-pay | Admitting: *Deleted

## 2018-03-18 ENCOUNTER — Ambulatory Visit (HOSPITAL_BASED_OUTPATIENT_CLINIC_OR_DEPARTMENT_OTHER): Payer: Medicare Other

## 2018-03-20 ENCOUNTER — Telehealth: Payer: Self-pay | Admitting: *Deleted

## 2018-03-20 NOTE — Telephone Encounter (Signed)
Unable to leave message on 03/19/18 to reschedule CTA chest aorta (vm full) 03/20/18 called home [place] and left message for patient to call and reschedule CTA chest aorta ordered by Dr. Jens Som

## 2018-03-23 ENCOUNTER — Encounter: Payer: Self-pay | Admitting: *Deleted

## 2018-03-23 NOTE — Telephone Encounter (Signed)
Spoke with patient regarding CTA chest/ aorta scheduled at [facility] on 04/14/18 at 10:00am--arrival time is 9:45am --clear liquids only 4 hours prior.  Will also mail information to patientg

## 2018-03-25 ENCOUNTER — Ambulatory Visit (HOSPITAL_BASED_OUTPATIENT_CLINIC_OR_DEPARTMENT_OTHER): Payer: Medicare Other

## 2018-04-02 DIAGNOSIS — I10 Essential (primary) hypertension: Secondary | ICD-10-CM | POA: Diagnosis not present

## 2018-04-02 DIAGNOSIS — D509 Iron deficiency anemia, unspecified: Secondary | ICD-10-CM | POA: Diagnosis not present

## 2018-04-02 DIAGNOSIS — E1142 Type 2 diabetes mellitus with diabetic polyneuropathy: Secondary | ICD-10-CM | POA: Diagnosis not present

## 2018-04-02 DIAGNOSIS — Z7984 Long term (current) use of oral hypoglycemic drugs: Secondary | ICD-10-CM | POA: Diagnosis not present

## 2018-04-08 ENCOUNTER — Ambulatory Visit (HOSPITAL_BASED_OUTPATIENT_CLINIC_OR_DEPARTMENT_OTHER): Payer: Medicare Other

## 2018-04-14 ENCOUNTER — Ambulatory Visit (HOSPITAL_BASED_OUTPATIENT_CLINIC_OR_DEPARTMENT_OTHER): Admission: RE | Admit: 2018-04-14 | Payer: Medicare Other | Source: Ambulatory Visit

## 2018-06-05 DIAGNOSIS — Z794 Long term (current) use of insulin: Secondary | ICD-10-CM | POA: Diagnosis not present

## 2018-06-05 DIAGNOSIS — K5900 Constipation, unspecified: Secondary | ICD-10-CM | POA: Diagnosis not present

## 2018-06-05 DIAGNOSIS — E1142 Type 2 diabetes mellitus with diabetic polyneuropathy: Secondary | ICD-10-CM | POA: Diagnosis not present

## 2018-06-05 DIAGNOSIS — I10 Essential (primary) hypertension: Secondary | ICD-10-CM | POA: Diagnosis not present

## 2018-06-12 DIAGNOSIS — Z7984 Long term (current) use of oral hypoglycemic drugs: Secondary | ICD-10-CM | POA: Diagnosis not present

## 2018-06-12 DIAGNOSIS — E1142 Type 2 diabetes mellitus with diabetic polyneuropathy: Secondary | ICD-10-CM | POA: Diagnosis not present

## 2018-07-06 DIAGNOSIS — E1142 Type 2 diabetes mellitus with diabetic polyneuropathy: Secondary | ICD-10-CM | POA: Diagnosis not present

## 2018-07-06 DIAGNOSIS — D509 Iron deficiency anemia, unspecified: Secondary | ICD-10-CM | POA: Diagnosis not present

## 2018-07-06 DIAGNOSIS — I10 Essential (primary) hypertension: Secondary | ICD-10-CM | POA: Diagnosis not present

## 2018-07-06 DIAGNOSIS — Z7984 Long term (current) use of oral hypoglycemic drugs: Secondary | ICD-10-CM | POA: Diagnosis not present

## 2018-07-16 ENCOUNTER — Telehealth: Payer: Self-pay | Admitting: *Deleted

## 2018-07-16 NOTE — Telephone Encounter (Signed)
Left message for patient to call and reschedule CTA

## 2018-07-22 ENCOUNTER — Encounter: Payer: Self-pay | Admitting: *Deleted

## 2018-07-22 NOTE — Telephone Encounter (Signed)
Called to reschedule CTA Chest Aorta that was ordered by Dr. Stanford Breed at [facility] location.  This was cancelled due to the pandemic.  I have been unable to reach the patient by phone---lett was mailed 07/22/18 requesting the patient call our office to reschedule.

## 2018-08-04 DIAGNOSIS — E1142 Type 2 diabetes mellitus with diabetic polyneuropathy: Secondary | ICD-10-CM | POA: Diagnosis not present

## 2018-08-04 DIAGNOSIS — I10 Essential (primary) hypertension: Secondary | ICD-10-CM | POA: Diagnosis not present

## 2018-08-04 DIAGNOSIS — Z794 Long term (current) use of insulin: Secondary | ICD-10-CM | POA: Diagnosis not present

## 2018-08-04 DIAGNOSIS — D509 Iron deficiency anemia, unspecified: Secondary | ICD-10-CM | POA: Diagnosis not present

## 2018-08-24 DIAGNOSIS — E1142 Type 2 diabetes mellitus with diabetic polyneuropathy: Secondary | ICD-10-CM | POA: Diagnosis not present

## 2018-08-24 DIAGNOSIS — D509 Iron deficiency anemia, unspecified: Secondary | ICD-10-CM | POA: Diagnosis not present

## 2018-08-24 DIAGNOSIS — Z7984 Long term (current) use of oral hypoglycemic drugs: Secondary | ICD-10-CM | POA: Diagnosis not present

## 2018-08-24 DIAGNOSIS — I10 Essential (primary) hypertension: Secondary | ICD-10-CM | POA: Diagnosis not present

## 2018-09-08 DIAGNOSIS — Z7984 Long term (current) use of oral hypoglycemic drugs: Secondary | ICD-10-CM | POA: Diagnosis not present

## 2018-09-08 DIAGNOSIS — D509 Iron deficiency anemia, unspecified: Secondary | ICD-10-CM | POA: Diagnosis not present

## 2018-09-08 DIAGNOSIS — E1142 Type 2 diabetes mellitus with diabetic polyneuropathy: Secondary | ICD-10-CM | POA: Diagnosis not present

## 2018-09-08 DIAGNOSIS — I10 Essential (primary) hypertension: Secondary | ICD-10-CM | POA: Diagnosis not present

## 2018-10-09 DIAGNOSIS — K5900 Constipation, unspecified: Secondary | ICD-10-CM | POA: Diagnosis not present

## 2018-10-09 DIAGNOSIS — Z23 Encounter for immunization: Secondary | ICD-10-CM | POA: Diagnosis not present

## 2018-10-09 DIAGNOSIS — Z794 Long term (current) use of insulin: Secondary | ICD-10-CM | POA: Diagnosis not present

## 2018-10-09 DIAGNOSIS — E1142 Type 2 diabetes mellitus with diabetic polyneuropathy: Secondary | ICD-10-CM | POA: Diagnosis not present

## 2018-10-09 DIAGNOSIS — I10 Essential (primary) hypertension: Secondary | ICD-10-CM | POA: Diagnosis not present

## 2018-10-22 ENCOUNTER — Other Ambulatory Visit: Payer: Self-pay | Admitting: Cardiology

## 2018-10-22 DIAGNOSIS — Z7984 Long term (current) use of oral hypoglycemic drugs: Secondary | ICD-10-CM | POA: Diagnosis not present

## 2018-10-22 DIAGNOSIS — E1142 Type 2 diabetes mellitus with diabetic polyneuropathy: Secondary | ICD-10-CM | POA: Diagnosis not present

## 2018-10-22 DIAGNOSIS — D509 Iron deficiency anemia, unspecified: Secondary | ICD-10-CM | POA: Diagnosis not present

## 2018-10-22 DIAGNOSIS — I10 Essential (primary) hypertension: Secondary | ICD-10-CM | POA: Diagnosis not present

## 2019-01-02 ENCOUNTER — Other Ambulatory Visit: Payer: Self-pay | Admitting: Cardiology

## 2019-01-22 ENCOUNTER — Other Ambulatory Visit: Payer: Self-pay

## 2019-01-26 ENCOUNTER — Other Ambulatory Visit: Payer: Self-pay

## 2019-02-18 ENCOUNTER — Telehealth: Payer: Self-pay | Admitting: *Deleted

## 2019-02-18 NOTE — Telephone Encounter (Signed)
A message was left. Re: his follow up visit.

## 2019-04-02 DIAGNOSIS — Z1389 Encounter for screening for other disorder: Secondary | ICD-10-CM | POA: Diagnosis not present

## 2019-04-02 DIAGNOSIS — E1142 Type 2 diabetes mellitus with diabetic polyneuropathy: Secondary | ICD-10-CM | POA: Diagnosis not present

## 2019-04-02 DIAGNOSIS — I441 Atrioventricular block, second degree: Secondary | ICD-10-CM | POA: Diagnosis not present

## 2019-04-02 DIAGNOSIS — Z794 Long term (current) use of insulin: Secondary | ICD-10-CM | POA: Diagnosis not present

## 2019-04-02 DIAGNOSIS — I10 Essential (primary) hypertension: Secondary | ICD-10-CM | POA: Diagnosis not present

## 2019-04-02 DIAGNOSIS — Z0000 Encounter for general adult medical examination without abnormal findings: Secondary | ICD-10-CM | POA: Diagnosis not present

## 2019-04-06 DIAGNOSIS — E1142 Type 2 diabetes mellitus with diabetic polyneuropathy: Secondary | ICD-10-CM | POA: Diagnosis not present

## 2019-04-21 DIAGNOSIS — I10 Essential (primary) hypertension: Secondary | ICD-10-CM | POA: Diagnosis not present

## 2019-04-23 DIAGNOSIS — D509 Iron deficiency anemia, unspecified: Secondary | ICD-10-CM | POA: Diagnosis not present

## 2019-04-23 DIAGNOSIS — I10 Essential (primary) hypertension: Secondary | ICD-10-CM | POA: Diagnosis not present

## 2019-04-23 DIAGNOSIS — E1142 Type 2 diabetes mellitus with diabetic polyneuropathy: Secondary | ICD-10-CM | POA: Diagnosis not present

## 2019-04-26 ENCOUNTER — Other Ambulatory Visit: Payer: Self-pay | Admitting: Cardiology

## 2019-05-07 DIAGNOSIS — I10 Essential (primary) hypertension: Secondary | ICD-10-CM | POA: Diagnosis not present

## 2019-05-18 DIAGNOSIS — I10 Essential (primary) hypertension: Secondary | ICD-10-CM | POA: Diagnosis not present

## 2019-05-18 DIAGNOSIS — E1142 Type 2 diabetes mellitus with diabetic polyneuropathy: Secondary | ICD-10-CM | POA: Diagnosis not present

## 2019-05-18 DIAGNOSIS — D509 Iron deficiency anemia, unspecified: Secondary | ICD-10-CM | POA: Diagnosis not present

## 2019-07-18 ENCOUNTER — Other Ambulatory Visit: Payer: Self-pay | Admitting: Cardiology

## 2019-08-24 DIAGNOSIS — Z5181 Encounter for therapeutic drug level monitoring: Secondary | ICD-10-CM | POA: Diagnosis not present

## 2019-08-24 DIAGNOSIS — B351 Tinea unguium: Secondary | ICD-10-CM | POA: Diagnosis not present

## 2019-08-24 DIAGNOSIS — I10 Essential (primary) hypertension: Secondary | ICD-10-CM | POA: Diagnosis not present

## 2019-08-24 DIAGNOSIS — Z794 Long term (current) use of insulin: Secondary | ICD-10-CM | POA: Diagnosis not present

## 2019-08-24 DIAGNOSIS — E1142 Type 2 diabetes mellitus with diabetic polyneuropathy: Secondary | ICD-10-CM | POA: Diagnosis not present

## 2019-08-31 ENCOUNTER — Encounter: Payer: Self-pay | Admitting: Podiatrist

## 2019-08-31 ENCOUNTER — Other Ambulatory Visit: Payer: Self-pay

## 2019-08-31 ENCOUNTER — Ambulatory Visit (INDEPENDENT_AMBULATORY_CARE_PROVIDER_SITE_OTHER): Payer: Medicare Other | Admitting: Podiatrist

## 2019-08-31 DIAGNOSIS — E1169 Type 2 diabetes mellitus with other specified complication: Secondary | ICD-10-CM | POA: Diagnosis not present

## 2019-08-31 DIAGNOSIS — B351 Tinea unguium: Secondary | ICD-10-CM | POA: Diagnosis not present

## 2019-08-31 DIAGNOSIS — M79609 Pain in unspecified limb: Secondary | ICD-10-CM | POA: Diagnosis not present

## 2019-08-31 DIAGNOSIS — E1142 Type 2 diabetes mellitus with diabetic polyneuropathy: Secondary | ICD-10-CM

## 2019-08-31 NOTE — Patient Instructions (Signed)
Diabetes Mellitus and Foot Care Foot care is an important part of your health, especially when you have diabetes. Diabetes may cause you to have problems because of poor blood flow (circulation) to your feet and legs, which can cause your skin to:  Become thinner and drier.  Break more easily.  Heal more slowly.  Peel and crack. You may also have nerve damage (neuropathy) in your legs and feet, causing decreased feeling in them. This means that you may not notice minor injuries to your feet that could lead to more serious problems. Noticing and addressing any potential problems early is the best way to prevent future foot problems. How to care for your feet Foot hygiene  Wash your feet daily with warm water and mild soap. Do not use hot water. Then, pat your feet and the areas between your toes until they are completely dry. Do not soak your feet as this can dry your skin.  Trim your toenails straight across. Do not dig under them or around the cuticle. File the edges of your nails with an emery board or nail file.  Apply a moisturizing lotion or petroleum jelly to the skin on your feet and to dry, brittle toenails. Use lotion that does not contain alcohol and is unscented. Do not apply lotion between your toes. Shoes and socks  Wear clean socks or stockings every day. Make sure they are not too tight. Do not wear knee-high stockings since they may decrease blood flow to your legs.  Wear shoes that fit properly and have enough cushioning. Always look in your shoes before you put them on to be sure there are no objects inside.  To break in new shoes, wear them for just a few hours a day. This prevents injuries on your feet. Wounds, scrapes, corns, and calluses  Check your feet daily for blisters, cuts, bruises, sores, and redness. If you cannot see the bottom of your feet, use a mirror or ask someone for help.  Do not cut corns or calluses or try to remove them with medicine.  If you  find a minor scrape, cut, or break in the skin on your feet, keep it and the skin around it clean and dry. You may clean these areas with mild soap and water. Do not clean the area with peroxide, alcohol, or iodine.  If you have a wound, scrape, corn, or callus on your foot, look at it several times a day to make sure it is healing and not infected. Check for: ? Redness, swelling, or pain. ? Fluid or blood. ? Warmth. ? Pus or a bad smell. General instructions  Do not cross your legs. This may decrease blood flow to your feet.  Do not use heating pads or hot water bottles on your feet. They may burn your skin. If you have lost feeling in your feet or legs, you may not know this is happening until it is too late.  Protect your feet from hot and cold by wearing shoes, such as at the beach or on hot pavement.  Schedule a complete foot exam at least once a year (annually) or more often if you have foot problems. If you have foot problems, report any cuts, sores, or bruises to your health care provider immediately. Contact a health care provider if:  You have a medical condition that increases your risk of infection and you have any cuts, sores, or bruises on your feet.  You have an injury that is not   healing.  You have redness on your legs or feet.  You feel burning or tingling in your legs or feet.  You have pain or cramps in your legs and feet.  Your legs or feet are numb.  Your feet always feel cold.  You have pain around a toenail. Get help right away if:  You have a wound, scrape, corn, or callus on your foot and: ? You have pain, swelling, or redness that gets worse. ? You have fluid or blood coming from the wound, scrape, corn, or callus. ? Your wound, scrape, corn, or callus feels warm to the touch. ? You have pus or a bad smell coming from the wound, scrape, corn, or callus. ? You have a fever. ? You have a red line going up your leg. Summary  Check your feet every day  for cuts, sores, red spots, swelling, and blisters.  Moisturize feet and legs daily.  Wear shoes that fit properly and have enough cushioning.  If you have foot problems, report any cuts, sores, or bruises to your health care provider immediately.  Schedule a complete foot exam at least once a year (annually) or more often if you have foot problems. This information is not intended to replace advice given to you by your health care provider. Make sure you discuss any questions you have with your health care provider. Document Revised: 09/16/2018 Document Reviewed: 01/26/2016 Elsevier Patient Education  2020 [employer]  

## 2019-09-02 NOTE — Progress Notes (Signed)
  Chief Complaint  Patient presents with  . Nail Problem    Thick, long, discolored toenails - bilateral. History of onychomycosis. Pt took terbinafine years ago.  . Diabetes    Most recent HgbA1c per pt = 7.1. "A little" neuropathy per pt. No history of ulcers. Pt does not receive pedicures.     HPI: Patient is 74 y.o. male who presents today for the concerns as listed above. He relates his great toenails are long and thick especially- he has taken lamisil in the past but states the nails stay discolored.  He is diabetic with neuropathy.   There are no problems to display for this patient.   Current Outpatient Medications on File Prior to Visit  Medication Sig Dispense Refill  . amLODipine (NORVASC) 10 MG tablet     . aspirin EC 81 MG tablet Take 81 mg by mouth daily.    . carvedilol (COREG) 6.25 MG tablet TAKE 1 TABLET BY MOUTH TWICE DAILY WITH MEALS 180 tablet 0  . hydrochlorothiazide (MICROZIDE) 12.5 MG capsule Take 1 capsule (12.5 mg total) by mouth daily. Please schedule annual appt with Dr. Jens Som for refills. [phone]. 1st attempt. 30 capsule 0  . irbesartan (AVAPRO) 300 MG tablet Take 300 mg by mouth daily.    . metFORMIN (GLUCOPHAGE) 1000 MG tablet Take 1,000 mg by mouth 2 (two) times daily with a meal.     . Omega-3 Fatty Acids (FISH OIL) 1000 MG CAPS Take by mouth.    . TRESIBA FLEXTOUCH 100 UNIT/ML SOPN FlexTouch Pen     . TRULICITY 1.5 MG/0.5ML SOPN once a week.     . vitamin B-12 (CYANOCOBALAMIN) 100 MCG tablet Take 100 mcg by mouth daily.     No current facility-administered medications on file prior to visit.    Allergies  Allergen Reactions  . Ace Inhibitors Cough    Review of Systems No fevers, chills, nausea, muscle aches, no difficulty breathing, no calf pain, no chest pain or shortness of breath.   Physical Exam  GENERAL APPEARANCE: Alert, conversant. Appropriately groomed. No acute distress.   VASCULAR: Pedal pulses palpable DP and PT bilateral.   Capillary refill time is immediate to all digits,  Proximal to distal cooling it warm to warm.  Digital perfusion adequate.   NEUROLOGIC: sensation is decreased to 5.07 monofilament at 2/5 sites bilateral.  Light touch is decreased bilateral, vibratory sensation decreased. bilateral  MUSCULOSKELETAL: acceptable muscle strength, tone and stability bilateral.  No gross boney pedal deformities noted.  No pain, crepitus or limitation noted with foot and ankle range of motion bilateral.   DERMATOLOGIC: skin is warm, supple, and dry.  No open lesions noted.  No rash, no pre ulcerative lesions. Digital nails are thick, discolored, dystrophic, brittle with subungual debris present and clinically mycotic x 10.      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  Onychoreduction of symptomatic toenails was performed via nail nipper and power burr without iatrogenic incident.  Patient was instructed on signs and symptoms of infection and was told to call immediately should any of these arise.

## 2019-09-07 DIAGNOSIS — I10 Essential (primary) hypertension: Secondary | ICD-10-CM | POA: Diagnosis not present

## 2019-09-07 DIAGNOSIS — D509 Iron deficiency anemia, unspecified: Secondary | ICD-10-CM | POA: Diagnosis not present

## 2019-09-07 DIAGNOSIS — E1142 Type 2 diabetes mellitus with diabetic polyneuropathy: Secondary | ICD-10-CM | POA: Diagnosis not present

## 2019-10-04 ENCOUNTER — Other Ambulatory Visit (HOSPITAL_BASED_OUTPATIENT_CLINIC_OR_DEPARTMENT_OTHER): Payer: Self-pay | Admitting: Internal Medicine

## 2019-10-04 ENCOUNTER — Ambulatory Visit: Payer: Medicare Other | Attending: Internal Medicine

## 2019-10-04 DIAGNOSIS — Z23 Encounter for immunization: Secondary | ICD-10-CM

## 2019-10-04 NOTE — Progress Notes (Signed)
° °  Covid-19 Vaccination Clinic  Name:  Blake Johnston    MRN: [id number] DOB: 1945-09-02  10/04/2019  [name] was observed post Covid-19 immunization for 15 minutes without incident. He was provided with Vaccine Information Sheet and instruction to access the V-Safe system.   [name] was instructed to call 911 with any severe reactions post vaccine:  Difficulty breathing   Swelling of face and throat   A fast heartbeat   A bad rash all over body   Dizziness and weakness

## 2019-10-07 DIAGNOSIS — D509 Iron deficiency anemia, unspecified: Secondary | ICD-10-CM | POA: Diagnosis not present

## 2019-10-07 DIAGNOSIS — I10 Essential (primary) hypertension: Secondary | ICD-10-CM | POA: Diagnosis not present

## 2019-10-07 DIAGNOSIS — E1142 Type 2 diabetes mellitus with diabetic polyneuropathy: Secondary | ICD-10-CM | POA: Diagnosis not present

## 2019-10-12 MED FILL — PFIZER-BIONTECH COVID-19 VA: 30 | 1 days supply | Qty: 0 | Fill #0

## 2019-10-15 ENCOUNTER — Other Ambulatory Visit: Payer: Self-pay | Admitting: Cardiology

## 2019-10-15 NOTE — Telephone Encounter (Signed)
Rx has been sent to the pharmacy electronically. ° °

## 2019-11-08 ENCOUNTER — Other Ambulatory Visit: Payer: Self-pay

## 2019-11-08 ENCOUNTER — Encounter: Payer: Self-pay | Admitting: Podiatrist

## 2019-11-08 ENCOUNTER — Ambulatory Visit (INDEPENDENT_AMBULATORY_CARE_PROVIDER_SITE_OTHER): Payer: Medicare Other | Admitting: Podiatrist

## 2019-11-08 DIAGNOSIS — E1142 Type 2 diabetes mellitus with diabetic polyneuropathy: Secondary | ICD-10-CM | POA: Diagnosis not present

## 2019-11-08 DIAGNOSIS — E1169 Type 2 diabetes mellitus with other specified complication: Secondary | ICD-10-CM | POA: Diagnosis not present

## 2019-11-08 DIAGNOSIS — B351 Tinea unguium: Secondary | ICD-10-CM | POA: Diagnosis not present

## 2019-11-08 NOTE — Progress Notes (Signed)
HPI: Patient is 74 y.o. male who presents today for continued diabetic foot and nail care.  He relates his great toenails are long and thick especially- he has taken lamisil in the past but states the nails stay discolored.  He is diabetic with neuropathy.           Allergies  Allergen Reactions  . Ace Inhibitors Cough      Review of Systems No fevers, chills, nausea, muscle aches, no difficulty breathing, no calf pain, no chest pain or shortness of breath.     Physical Exam   GENERAL APPEARANCE: Alert, conversant. Appropriately groomed. No acute distress.    VASCULAR: Pedal pulses palpable DP and PT bilateral.  Capillary refill time is immediate to all digits,  Proximal to distal cooling it warm to warm.  Digital perfusion adequate.    NEUROLOGIC: sensation is decreased to 5.07 monofilament at 2/5 sites bilateral.  Light touch is decreased bilateral, vibratory sensation decreased. bilateral   MUSCULOSKELETAL: acceptable muscle strength, tone and stability bilateral.  Prominent fifth metatarsal base is noted bilateral. No pain, crepitus or limitation noted with foot and ankle range of motion bilateral.    DERMATOLOGIC: skin is warm, supple, and dry.  No open lesions noted.  No rash, small corn on the lateral aspect of the fifth digit is noted left foot.. Digital nails are thick, discolored, dystrophic, brittle with subungual debris present and clinically mycotic x 10.         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  Onychoreduction of symptomatic toenails was performed via nail nipper and power burr without iatrogenic incident.  Patient was instructed on signs and symptoms of infection and was told to call immediately should any of these arise.

## 2019-11-18 ENCOUNTER — Ambulatory Visit: Payer: Medicare Other | Admitting: Podiatrist

## 2019-11-25 DIAGNOSIS — Z961 Presence of intraocular lens: Secondary | ICD-10-CM | POA: Diagnosis not present

## 2019-11-25 DIAGNOSIS — H33021 Retinal detachment with multiple breaks, right eye: Secondary | ICD-10-CM | POA: Diagnosis not present

## 2019-11-25 DIAGNOSIS — H35341 Macular cyst, hole, or pseudohole, right eye: Secondary | ICD-10-CM | POA: Diagnosis not present

## 2019-11-25 DIAGNOSIS — H33312 Horseshoe tear of retina without detachment, left eye: Secondary | ICD-10-CM | POA: Diagnosis not present

## 2019-11-25 DIAGNOSIS — H40151 Residual stage of open-angle glaucoma, right eye: Secondary | ICD-10-CM | POA: Diagnosis not present

## 2019-11-25 DIAGNOSIS — H25812 Combined forms of age-related cataract, left eye: Secondary | ICD-10-CM | POA: Diagnosis not present

## 2019-11-25 DIAGNOSIS — H4720 Unspecified optic atrophy: Secondary | ICD-10-CM | POA: Insufficient documentation

## 2019-11-28 ENCOUNTER — Other Ambulatory Visit: Payer: Self-pay | Admitting: Cardiology

## 2019-12-15 DIAGNOSIS — Z23 Encounter for immunization: Secondary | ICD-10-CM | POA: Diagnosis not present

## 2019-12-15 DIAGNOSIS — K5900 Constipation, unspecified: Secondary | ICD-10-CM | POA: Diagnosis not present

## 2019-12-15 DIAGNOSIS — I10 Essential (primary) hypertension: Secondary | ICD-10-CM | POA: Diagnosis not present

## 2019-12-15 DIAGNOSIS — Z794 Long term (current) use of insulin: Secondary | ICD-10-CM | POA: Diagnosis not present

## 2019-12-15 DIAGNOSIS — E1142 Type 2 diabetes mellitus with diabetic polyneuropathy: Secondary | ICD-10-CM | POA: Diagnosis not present

## 2019-12-15 DIAGNOSIS — G4700 Insomnia, unspecified: Secondary | ICD-10-CM | POA: Diagnosis not present

## 2020-01-30 ENCOUNTER — Other Ambulatory Visit: Payer: Self-pay | Admitting: Cardiology

## 2020-02-14 DIAGNOSIS — R5383 Other fatigue: Secondary | ICD-10-CM | POA: Diagnosis not present

## 2020-02-14 DIAGNOSIS — I10 Essential (primary) hypertension: Secondary | ICD-10-CM | POA: Diagnosis not present

## 2020-02-14 DIAGNOSIS — F410 Panic disorder [episodic paroxysmal anxiety] without agoraphobia: Secondary | ICD-10-CM | POA: Diagnosis not present

## 2020-02-16 ENCOUNTER — Other Ambulatory Visit: Payer: Self-pay

## 2020-02-16 ENCOUNTER — Ambulatory Visit (INDEPENDENT_AMBULATORY_CARE_PROVIDER_SITE_OTHER): Payer: Medicare Other | Admitting: Podiatry

## 2020-02-16 ENCOUNTER — Encounter: Payer: Self-pay | Admitting: Podiatry

## 2020-02-16 DIAGNOSIS — B351 Tinea unguium: Secondary | ICD-10-CM | POA: Diagnosis not present

## 2020-02-16 DIAGNOSIS — M79609 Pain in unspecified limb: Secondary | ICD-10-CM

## 2020-02-20 NOTE — Progress Notes (Signed)
Subjective:  Patient ID: Blake Johnston, male    DOB: 1945-05-11,  MRN: [id number]  75 y.o. male presents with preventative diabetic foot care and painful thick toenails that are difficult to trim. Pain interferes with ambulation. Aggravating factors include wearing enclosed shoe gear. Pain is relieved with periodic professional debridement..    Patient did not check blood glucose this morning. He states he is getting a new glucometer on today. He states he has been applying OptiNail on his toenails.   PCP: Kirby Funk, MD and last visit was: 12/15/2019.  Review of Systems: Negative except as noted in the HPI.    Current Outpatient Medications:  .  timolol (TIMOPTIC) 0.5 % ophthalmic solution, Place 1 drop into the right eye 2 times daily., Disp: , Rfl:  .  amLODipine (NORVASC) 10 MG tablet, , Disp: , Rfl:  .  aspirin EC 81 MG tablet, Take 81 mg by mouth daily., Disp: , Rfl:  .  carvedilol (COREG) 6.25 MG tablet, TAKE 1 TABLET BY MOUTH TWICE DAILY WITH MEALS . APPOINTMENT REQUIRED FOR FUTURE REFILLS, Disp: 15 tablet, Rfl: 0 .  chlorthalidone (HYGROTON) 25 MG tablet, Take 25 mg by mouth every morning., Disp: , Rfl:  .  hydrochlorothiazide (MICROZIDE) 12.5 MG capsule, Take 1 capsule (12.5 mg total) by mouth daily. Please schedule annual appt with Dr. Jens Som for refills. [phone]. 1st attempt., Disp: 30 capsule, Rfl: 0 .  irbesartan (AVAPRO) 300 MG tablet, Take 300 mg by mouth daily., Disp: , Rfl:  .  LORazepam (ATIVAN) 1 MG tablet, Take 1 mg by mouth 2 (two) times daily., Disp: , Rfl:  .  metFORMIN (GLUCOPHAGE) 1000 MG tablet, Take 1,000 mg by mouth 2 (two) times daily with a meal. , Disp: , Rfl:  .  Omega-3 Fatty Acids (FISH OIL) 1000 MG CAPS, Take by mouth., Disp: , Rfl:  .  sertraline (ZOLOFT) 50 MG tablet, Take by mouth., Disp: , Rfl:  .  timolol (TIMOPTIC) 0.5 % ophthalmic solution, Place 1 drop into the right eye 2 (two) times daily., Disp: , Rfl:  .  TRESIBA FLEXTOUCH  100 UNIT/ML SOPN FlexTouch Pen, , Disp: , Rfl:  .  TRULICITY 1.5 MG/0.5ML SOPN, once a week. , Disp: , Rfl:  .  vitamin B-12 (CYANOCOBALAMIN) 100 MCG tablet, Take 100 mcg by mouth daily., Disp: , Rfl:  Allergies  Allergen Reactions  . Ace Inhibitors Cough    Objective:  There were no vitals filed for this visit. Constitutional Patient is a pleasant 75 y.o. African American male in NAD. AAO x 3.  Vascular Capillary fill time to digits <3 seconds b/l lower extremities. Palpable pedal pulses b/l LE. Pedal hair absent. Lower extremity skin temperature gradient within normal limits. No pain with calf compression b/l. No cyanosis or clubbing noted.  Neurologic Normal speech. Protective sensation decreased with 10 gram monofilament b/l. Vibratory sensation decreased b/l.  Dermatologic Pedal skin with normal turgor, texture and tone bilaterally. No open wounds bilaterally. No interdigital macerations bilaterally. Toenails 1-5 b/l elongated, discolored, dystrophic, thickened, crumbly with subungual debris and tenderness to dorsal palpation.  Orthopedic: Normal muscle strength 5/5 to all lower extremity muscle groups bilaterally. No pain crepitus or joint limitation noted with ROM b/l. No gross bony deformities bilaterally.     Assessment:   1. Pain due to onychomycosis of nail    Plan:  Patient was evaluated and treated and all questions answered.  Onychomycosis with pain -Nails palliatively debridement as below. -Educated on self-care  Procedure: Nail Debridement Rationale: Pain Type of Debridement: manual, sharp debridement. Instrumentation: Nail nipper, rotary burr. Number of Nails: 10  -Examined patient. -Patient to continue soft, supportive shoe gear daily. -Toenails 1-5 b/l were debrided in length and girth with sterile nail nippers and dremel without iatrogenic bleeding.  -Patient to report any pedal injuries to medical professional immediately. -Patient/POA to call should there be  question/concern in the interim.  Return in about 3 months (around 05/15/2020).  Freddie Breech, DPM

## 2020-02-28 DIAGNOSIS — F410 Panic disorder [episodic paroxysmal anxiety] without agoraphobia: Secondary | ICD-10-CM | POA: Diagnosis not present

## 2020-04-03 DIAGNOSIS — E1142 Type 2 diabetes mellitus with diabetic polyneuropathy: Secondary | ICD-10-CM | POA: Diagnosis not present

## 2020-04-03 DIAGNOSIS — Z1389 Encounter for screening for other disorder: Secondary | ICD-10-CM | POA: Diagnosis not present

## 2020-04-03 DIAGNOSIS — Z1211 Encounter for screening for malignant neoplasm of colon: Secondary | ICD-10-CM | POA: Diagnosis not present

## 2020-04-03 DIAGNOSIS — F419 Anxiety disorder, unspecified: Secondary | ICD-10-CM | POA: Diagnosis not present

## 2020-04-03 DIAGNOSIS — Z0000 Encounter for general adult medical examination without abnormal findings: Secondary | ICD-10-CM | POA: Diagnosis not present

## 2020-04-03 DIAGNOSIS — I10 Essential (primary) hypertension: Secondary | ICD-10-CM | POA: Diagnosis not present

## 2020-04-03 DIAGNOSIS — R5383 Other fatigue: Secondary | ICD-10-CM | POA: Diagnosis not present

## 2020-04-03 DIAGNOSIS — Z794 Long term (current) use of insulin: Secondary | ICD-10-CM | POA: Diagnosis not present

## 2020-04-03 DIAGNOSIS — F410 Panic disorder [episodic paroxysmal anxiety] without agoraphobia: Secondary | ICD-10-CM | POA: Diagnosis not present

## 2020-04-11 ENCOUNTER — Ambulatory Visit: Payer: Medicare Other | Attending: Internal Medicine

## 2020-04-11 DIAGNOSIS — Z23 Encounter for immunization: Secondary | ICD-10-CM

## 2020-04-11 NOTE — Progress Notes (Signed)
   Covid-19 Vaccination Clinic  Name:  Blake Johnston    MRN: [id number] DOB: [birth year]  04/11/2020  [name] was observed post Covid-19 immunization for 15 minutes without incident. He was provided with Vaccine Information Sheet and instruction to access the V-Safe system.   [name] was instructed to call 911 with any severe reactions post vaccine: Marland Kitchen Difficulty breathing  . Swelling of face and throat  . A fast heartbeat  . A bad rash all over body  . Dizziness and weakness   Immunizations Administered    Name Date Dose VIS Date Route   PFIZER Comrnaty(Gray TOP) Covid-19 Vaccine 04/11/2020 11:40 AM 0.3 mL 12/16/2019 Intramuscular   Manufacturer: [employer], [employer]   Lot: [id number]   NDC: [phone]

## 2020-04-18 ENCOUNTER — Other Ambulatory Visit (HOSPITAL_BASED_OUTPATIENT_CLINIC_OR_DEPARTMENT_OTHER): Payer: Self-pay

## 2020-04-18 MED ORDER — PFIZER-BIONT COVID-19 VAC-TRIS 30 MCG/0.3ML IM SUSP
INTRAMUSCULAR | 0 refills | Status: AC
  Filled 2020-04-18: qty 0.3, 1d supply, fill #0

## 2020-05-18 DIAGNOSIS — H31011 Macula scars of posterior pole (postinflammatory) (post-traumatic), right eye: Secondary | ICD-10-CM | POA: Diagnosis not present

## 2020-05-18 DIAGNOSIS — H2512 Age-related nuclear cataract, left eye: Secondary | ICD-10-CM | POA: Diagnosis not present

## 2020-05-29 ENCOUNTER — Ambulatory Visit: Payer: Medicare Other | Admitting: Podiatry

## 2020-06-07 ENCOUNTER — Other Ambulatory Visit: Payer: Self-pay

## 2020-06-07 ENCOUNTER — Ambulatory Visit (INDEPENDENT_AMBULATORY_CARE_PROVIDER_SITE_OTHER): Payer: Medicare Other | Admitting: Podiatry

## 2020-06-07 ENCOUNTER — Encounter: Payer: Self-pay | Admitting: Podiatry

## 2020-06-07 DIAGNOSIS — E1142 Type 2 diabetes mellitus with diabetic polyneuropathy: Secondary | ICD-10-CM

## 2020-06-07 DIAGNOSIS — B351 Tinea unguium: Secondary | ICD-10-CM

## 2020-06-07 DIAGNOSIS — M79676 Pain in unspecified toe(s): Secondary | ICD-10-CM

## 2020-06-07 DIAGNOSIS — L84 Corns and callosities: Secondary | ICD-10-CM | POA: Diagnosis not present

## 2020-06-14 NOTE — Progress Notes (Signed)
  Subjective:  Patient ID: Blake Johnston, male    DOB: [birth year],  MRN: [id number]  75 y.o. male presents at risk foot care with history of diabetic neuropathy and corn(s) left 5th toe , callus(es) left foot and painful mycotic nails.  Pain interferes with ambulation. Aggravating factors include wearing enclosed shoe gear. Painful toenails interfere with ambulation. Aggravating factors include wearing enclosed shoe gear. Pain is relieved with periodic professional debridement. Painful corns and calluses are aggravated when weightbearing with and without shoegear. Pain is relieved with periodic professional debridement.   Patient does not monitor his blood glucose daily.  His PCP is Dr. Lavone Orn and last visit was 04/03/2020.  He notes no new pedal problems on today's visit.  Allergies  Allergen Reactions  . Ace Inhibitors Cough    Other reaction(s): cough    Review of Systems: Negative except as noted in the HPI.   Objective:   Constitutional Pt is a pleasant 75 y.o. African American male WD, WN in NAD. AAO x 3.   Vascular Neurovascular status unchanged b/l lower extremities. Capillary fill time to digits <3 seconds b/l lower extremities. Palpable DP pulse(s) b/l lower extremities Palpable PT pulse(s) b/l lower extremities Pedal hair absent. Lower extremity skin temperature gradient within normal limits. No pain with calf compression b/l.  Neurologic Protective sensation decreased with 10 gram monofilament b/l. Vibratory sensation decreased b/l.  Dermatologic Pedal skin with normal turgor, texture and tone bilaterally. No open wounds bilaterally. No interdigital macerations bilaterally. Toenails 1-5 b/l elongated, discolored, dystrophic, thickened, crumbly with subungual debris and tenderness to dorsal palpation. Hyperkeratotic lesion(s) L 5th toe and sub 5th met base left foot.  No erythema, no edema, no drainage, no fluctuance.  Orthopedic: Normal muscle strength 5/5 to all  lower extremity muscle groups bilaterally. No pain crepitus or joint limitation noted with ROM b/l. Hammertoe(s) noted to the L 5th toe.   Radiographs: None Assessment:   1. Pain due to onychomycosis of nail   2. Corns and callosities   3. Diabetic peripheral neuropathy associated with type 2 diabetes mellitus ([place])    Plan:  Patient was evaluated and treated and all questions answered.  Onychomycosis with pain -Nails palliatively debridement as below. -Educated on self-care  Procedure: Nail Debridement Rationale: Pain Type of Debridement: manual, sharp debridement. Instrumentation: Nail nipper, rotary burr. Number of Nails: 10  -Examined patient. -Continue diabetic foot care principles. -Patient to continue soft, supportive shoe gear daily. -Toenails 1-5 b/l were debrided in length and girth with sterile nail nippers and dremel without iatrogenic bleeding.  -Corn(s) L 5th toe and callus(es) sub 5th met base left foot were pared utilizing sterile scalpel blade without incident. Total number debrided =2. -Patient to report any pedal injuries to medical professional immediately. -Patient/POA to call should there be question/concern in the interim.  Return in about 3 months (around 09/07/2020).  Marzetta Board, DPM

## 2020-07-21 DIAGNOSIS — Z1211 Encounter for screening for malignant neoplasm of colon: Secondary | ICD-10-CM | POA: Diagnosis not present

## 2020-08-03 DIAGNOSIS — E1142 Type 2 diabetes mellitus with diabetic polyneuropathy: Secondary | ICD-10-CM | POA: Diagnosis not present

## 2020-08-03 DIAGNOSIS — F419 Anxiety disorder, unspecified: Secondary | ICD-10-CM | POA: Diagnosis not present

## 2020-08-03 DIAGNOSIS — I10 Essential (primary) hypertension: Secondary | ICD-10-CM | POA: Diagnosis not present

## 2020-08-03 DIAGNOSIS — F410 Panic disorder [episodic paroxysmal anxiety] without agoraphobia: Secondary | ICD-10-CM | POA: Diagnosis not present

## 2020-08-31 DIAGNOSIS — I10 Essential (primary) hypertension: Secondary | ICD-10-CM | POA: Diagnosis not present

## 2020-08-31 DIAGNOSIS — D509 Iron deficiency anemia, unspecified: Secondary | ICD-10-CM | POA: Diagnosis not present

## 2020-08-31 DIAGNOSIS — K219 Gastro-esophageal reflux disease without esophagitis: Secondary | ICD-10-CM | POA: Diagnosis not present

## 2020-08-31 DIAGNOSIS — G4700 Insomnia, unspecified: Secondary | ICD-10-CM | POA: Diagnosis not present

## 2020-08-31 DIAGNOSIS — E1142 Type 2 diabetes mellitus with diabetic polyneuropathy: Secondary | ICD-10-CM | POA: Diagnosis not present

## 2020-09-18 ENCOUNTER — Ambulatory Visit: Payer: Medicare Other | Admitting: Podiatry

## 2020-09-26 ENCOUNTER — Ambulatory Visit: Payer: Medicare Other | Admitting: Podiatry

## 2020-10-02 ENCOUNTER — Ambulatory Visit: Payer: Medicare Other | Attending: Internal Medicine

## 2020-10-02 DIAGNOSIS — Z23 Encounter for immunization: Secondary | ICD-10-CM

## 2020-10-02 NOTE — Progress Notes (Signed)
   Covid-19 Vaccination Clinic  Name:  Blake Johnston    MRN: [id number] DOB: [birth year]  10/02/2020  [name] was observed post Covid-19 immunization for 15 minutes without incident. He was provided with Vaccine Information Sheet and instruction to access the V-Safe system.   [name] was instructed to call 911 with any severe reactions post vaccine: Difficulty breathing  Swelling of face and throat  A fast heartbeat  A bad rash all over body  Dizziness and weakness

## 2020-10-03 ENCOUNTER — Encounter: Payer: Self-pay | Admitting: Podiatry

## 2020-10-03 ENCOUNTER — Ambulatory Visit (INDEPENDENT_AMBULATORY_CARE_PROVIDER_SITE_OTHER): Payer: Medicare Other | Admitting: Podiatry

## 2020-10-03 ENCOUNTER — Other Ambulatory Visit: Payer: Self-pay

## 2020-10-03 DIAGNOSIS — M79609 Pain in unspecified limb: Secondary | ICD-10-CM

## 2020-10-03 DIAGNOSIS — B351 Tinea unguium: Secondary | ICD-10-CM

## 2020-10-03 DIAGNOSIS — E1142 Type 2 diabetes mellitus with diabetic polyneuropathy: Secondary | ICD-10-CM

## 2020-10-03 NOTE — Progress Notes (Signed)
This patient returns to my office for at risk foot care.  This patient requires this care by a professional since this patient will be at risk due to having diabetes.  This patient is unable to cut nails himself since the patient cannot reach his nails.These nails are painful walking and wearing shoes.  This patient presents for at risk foot care today.  General Appearance  Alert, conversant and in no acute stress.  Vascular  Dorsalis pedis and posterior tibial  pulses are palpable  bilaterally.  Capillary return is within normal limits  bilaterally. Temperature is within normal limits  bilaterally.  Neurologic  Senn-Weinstein monofilament wire test diminished  bilaterally. Muscle power within normal limits bilaterally.  Nails Thick disfigured discolored nails with subungual debris  from hallux to fifth toes bilaterally. No evidence of bacterial infection or drainage bilaterally.  Orthopedic  No limitations of motion  feet .  No crepitus or effusions noted.  No bony pathology or digital deformities noted.  Skin  normotropic skin with no porokeratosis noted bilaterally.  No signs of infections or ulcers noted.     Onychomycosis  Pain in right toes  Pain in left toes  Consent was obtained for treatment procedures.   Mechanical debridement of nails 1-5  bilaterally performed with a nail nipper.  Filed with dremel without incident.    Return office visit   3 months                   Told patient to return for periodic foot care and evaluation due to potential at risk complications.   Taleia Sadowski DPM  

## 2020-10-10 ENCOUNTER — Other Ambulatory Visit (HOSPITAL_BASED_OUTPATIENT_CLINIC_OR_DEPARTMENT_OTHER): Payer: Self-pay

## 2020-10-10 DIAGNOSIS — Z23 Encounter for immunization: Secondary | ICD-10-CM | POA: Diagnosis not present

## 2020-10-10 MED ORDER — COVID-19MRNA BIVAL VACC PFIZER 30 MCG/0.3ML IM SUSP
INTRAMUSCULAR | 0 refills | Status: AC
  Filled 2020-10-10: qty 0.3, 1d supply, fill #0

## 2020-11-16 DIAGNOSIS — H33312 Horseshoe tear of retina without detachment, left eye: Secondary | ICD-10-CM | POA: Diagnosis not present

## 2020-11-16 DIAGNOSIS — H35341 Macular cyst, hole, or pseudohole, right eye: Secondary | ICD-10-CM | POA: Diagnosis not present

## 2020-11-16 DIAGNOSIS — H33021 Retinal detachment with multiple breaks, right eye: Secondary | ICD-10-CM | POA: Diagnosis not present

## 2020-12-07 DIAGNOSIS — R29898 Other symptoms and signs involving the musculoskeletal system: Secondary | ICD-10-CM | POA: Diagnosis not present

## 2020-12-07 DIAGNOSIS — I10 Essential (primary) hypertension: Secondary | ICD-10-CM | POA: Diagnosis not present

## 2020-12-07 DIAGNOSIS — Z23 Encounter for immunization: Secondary | ICD-10-CM | POA: Diagnosis not present

## 2020-12-07 DIAGNOSIS — Z794 Long term (current) use of insulin: Secondary | ICD-10-CM | POA: Diagnosis not present

## 2020-12-07 DIAGNOSIS — E1142 Type 2 diabetes mellitus with diabetic polyneuropathy: Secondary | ICD-10-CM | POA: Diagnosis not present

## 2021-01-03 ENCOUNTER — Other Ambulatory Visit: Payer: Self-pay

## 2021-01-03 ENCOUNTER — Encounter: Payer: Self-pay | Admitting: Podiatry

## 2021-01-03 ENCOUNTER — Ambulatory Visit (INDEPENDENT_AMBULATORY_CARE_PROVIDER_SITE_OTHER): Payer: Medicare Other | Admitting: Podiatry

## 2021-01-03 DIAGNOSIS — B351 Tinea unguium: Secondary | ICD-10-CM

## 2021-01-03 DIAGNOSIS — M79609 Pain in unspecified limb: Secondary | ICD-10-CM | POA: Diagnosis not present

## 2021-01-03 DIAGNOSIS — E1142 Type 2 diabetes mellitus with diabetic polyneuropathy: Secondary | ICD-10-CM

## 2021-01-03 NOTE — Progress Notes (Signed)
This patient returns to my office for at risk foot care.  This patient requires this care by a professional since this patient will be at risk due to having diabetes.  This patient is unable to cut nails himself since the patient cannot reach his nails.These nails are painful walking and wearing shoes.  This patient presents for at risk foot care today.  General Appearance  Alert, conversant and in no acute stress.  Vascular  Dorsalis pedis and posterior tibial  pulses are palpable  bilaterally.  Capillary return is within normal limits  bilaterally. Temperature is within normal limits  bilaterally.  Neurologic  Senn-Weinstein monofilament wire test diminished  bilaterally. Muscle power within normal limits bilaterally.  Nails Thick disfigured discolored nails with subungual debris  from hallux to fifth toes bilaterally. No evidence of bacterial infection or drainage bilaterally.  Orthopedic  No limitations of motion  feet .  No crepitus or effusions noted.  No bony pathology or digital deformities noted.  Skin  normotropic skin with no porokeratosis noted bilaterally.  No signs of infections or ulcers noted.     Onychomycosis  Pain in right toes  Pain in left toes  Consent was obtained for treatment procedures.   Mechanical debridement of nails 1-5  bilaterally performed with a nail nipper.  Filed with dremel without incident.    Return office visit   3 months                   Told patient to return for periodic foot care and evaluation due to potential at risk complications.   Ezma Rehm DPM  

## 2021-01-04 DIAGNOSIS — D509 Iron deficiency anemia, unspecified: Secondary | ICD-10-CM | POA: Diagnosis not present

## 2021-01-04 DIAGNOSIS — E1142 Type 2 diabetes mellitus with diabetic polyneuropathy: Secondary | ICD-10-CM | POA: Diagnosis not present

## 2021-01-04 DIAGNOSIS — G4700 Insomnia, unspecified: Secondary | ICD-10-CM | POA: Diagnosis not present

## 2021-01-04 DIAGNOSIS — I10 Essential (primary) hypertension: Secondary | ICD-10-CM | POA: Diagnosis not present

## 2021-01-04 DIAGNOSIS — K219 Gastro-esophageal reflux disease without esophagitis: Secondary | ICD-10-CM | POA: Diagnosis not present

## 2021-02-01 DIAGNOSIS — E1142 Type 2 diabetes mellitus with diabetic polyneuropathy: Secondary | ICD-10-CM | POA: Diagnosis not present

## 2021-02-01 DIAGNOSIS — I10 Essential (primary) hypertension: Secondary | ICD-10-CM | POA: Diagnosis not present

## 2021-02-01 DIAGNOSIS — D509 Iron deficiency anemia, unspecified: Secondary | ICD-10-CM | POA: Diagnosis not present

## 2021-02-01 DIAGNOSIS — G4700 Insomnia, unspecified: Secondary | ICD-10-CM | POA: Diagnosis not present

## 2021-02-01 DIAGNOSIS — K219 Gastro-esophageal reflux disease without esophagitis: Secondary | ICD-10-CM | POA: Diagnosis not present

## 2021-04-05 DIAGNOSIS — I441 Atrioventricular block, second degree: Secondary | ICD-10-CM | POA: Diagnosis not present

## 2021-04-05 DIAGNOSIS — Z0000 Encounter for general adult medical examination without abnormal findings: Secondary | ICD-10-CM | POA: Diagnosis not present

## 2021-04-05 DIAGNOSIS — E1142 Type 2 diabetes mellitus with diabetic polyneuropathy: Secondary | ICD-10-CM | POA: Diagnosis not present

## 2021-04-05 DIAGNOSIS — F419 Anxiety disorder, unspecified: Secondary | ICD-10-CM | POA: Diagnosis not present

## 2021-04-05 DIAGNOSIS — Z794 Long term (current) use of insulin: Secondary | ICD-10-CM | POA: Diagnosis not present

## 2021-04-05 DIAGNOSIS — I10 Essential (primary) hypertension: Secondary | ICD-10-CM | POA: Diagnosis not present

## 2021-04-05 DIAGNOSIS — I7121 Aneurysm of the ascending aorta, without rupture: Secondary | ICD-10-CM | POA: Diagnosis not present

## 2021-04-05 DIAGNOSIS — Z1389 Encounter for screening for other disorder: Secondary | ICD-10-CM | POA: Diagnosis not present

## 2021-04-05 DIAGNOSIS — F410 Panic disorder [episodic paroxysmal anxiety] without agoraphobia: Secondary | ICD-10-CM | POA: Diagnosis not present

## 2021-04-06 ENCOUNTER — Encounter: Payer: Self-pay | Admitting: Podiatry

## 2021-04-06 ENCOUNTER — Ambulatory Visit (INDEPENDENT_AMBULATORY_CARE_PROVIDER_SITE_OTHER): Payer: Medicare Other | Admitting: Podiatry

## 2021-04-06 DIAGNOSIS — E1142 Type 2 diabetes mellitus with diabetic polyneuropathy: Secondary | ICD-10-CM

## 2021-04-06 DIAGNOSIS — B351 Tinea unguium: Secondary | ICD-10-CM

## 2021-04-06 DIAGNOSIS — M79609 Pain in unspecified limb: Secondary | ICD-10-CM

## 2021-04-06 NOTE — Progress Notes (Signed)
This patient returns to my office for at risk foot care.  This patient requires this care by a professional since this patient will be at risk due to having diabetes.  This patient is unable to cut nails himself since the patient cannot reach his nails.These nails are painful walking and wearing shoes.  This patient presents for at risk foot care today.  General Appearance  Alert, conversant and in no acute stress.  Vascular  Dorsalis pedis and posterior tibial  pulses are palpable  bilaterally.  Capillary return is within normal limits  bilaterally. Temperature is within normal limits  bilaterally.  Neurologic  Senn-Weinstein monofilament wire test diminished  bilaterally. Muscle power within normal limits bilaterally.  Nails Thick disfigured discolored nails with subungual debris  from hallux to fifth toes bilaterally. No evidence of bacterial infection or drainage bilaterally.  Orthopedic  No limitations of motion  feet .  No crepitus or effusions noted.  No bony pathology or digital deformities noted.  Skin  normotropic skin with no porokeratosis noted bilaterally.  No signs of infections or ulcers noted.     Onychomycosis  Pain in right toes  Pain in left toes  Consent was obtained for treatment procedures.   Mechanical debridement of nails 1-5  bilaterally performed with a nail nipper.  Filed with dremel without incident.    Return office visit   3 months                   Told patient to return for periodic foot care and evaluation due to potential at risk complications.   Calden Dorsey DPM  

## 2021-07-11 ENCOUNTER — Encounter: Payer: Self-pay | Admitting: Podiatry

## 2021-07-11 ENCOUNTER — Ambulatory Visit (INDEPENDENT_AMBULATORY_CARE_PROVIDER_SITE_OTHER): Payer: Medicare Other | Admitting: Podiatry

## 2021-07-11 DIAGNOSIS — M79609 Pain in unspecified limb: Secondary | ICD-10-CM

## 2021-07-11 DIAGNOSIS — E1142 Type 2 diabetes mellitus with diabetic polyneuropathy: Secondary | ICD-10-CM | POA: Diagnosis not present

## 2021-07-11 DIAGNOSIS — B351 Tinea unguium: Secondary | ICD-10-CM

## 2021-07-11 NOTE — Progress Notes (Signed)
This patient returns to my office for at risk foot care.  This patient requires this care by a professional since this patient will be at risk due to having diabetes.  This patient is unable to cut nails himself since the patient cannot reach his nails.These nails are painful walking and wearing shoes.  This patient presents for at risk foot care today.  General Appearance  Alert, conversant and in no acute stress.  Vascular  Dorsalis pedis and posterior tibial  pulses are palpable  bilaterally.  Capillary return is within normal limits  bilaterally. Temperature is within normal limits  bilaterally.  Neurologic  Senn-Weinstein monofilament wire test diminished  bilaterally. Muscle power within normal limits bilaterally.  Nails Thick disfigured discolored nails with subungual debris  from hallux to fifth toes bilaterally. No evidence of bacterial infection or drainage bilaterally.  Orthopedic  No limitations of motion  feet .  No crepitus or effusions noted.  No bony pathology or digital deformities noted.  Skin  normotropic skin with no porokeratosis noted bilaterally.  No signs of infections or ulcers noted.  Asymptomatic 5th metabase callus plantarly.   Onychomycosis  Pain in right toes  Pain in left toes  Consent was obtained for treatment procedures.   Mechanical debridement of nails 1-5  bilaterally performed with a nail nipper.  Filed with dremel without incident.    Return office visit   3 months                  Told patient to return for periodic foot care and evaluation due to potential at risk complications.   Helane Gunther DPM

## 2021-08-06 DIAGNOSIS — Z794 Long term (current) use of insulin: Secondary | ICD-10-CM | POA: Diagnosis not present

## 2021-08-06 DIAGNOSIS — I10 Essential (primary) hypertension: Secondary | ICD-10-CM | POA: Diagnosis not present

## 2021-08-06 DIAGNOSIS — F419 Anxiety disorder, unspecified: Secondary | ICD-10-CM | POA: Diagnosis not present

## 2021-08-06 DIAGNOSIS — E1142 Type 2 diabetes mellitus with diabetic polyneuropathy: Secondary | ICD-10-CM | POA: Diagnosis not present

## 2021-09-18 ENCOUNTER — Ambulatory Visit (INDEPENDENT_AMBULATORY_CARE_PROVIDER_SITE_OTHER): Payer: Medicare Other | Admitting: Podiatry

## 2021-09-18 DIAGNOSIS — B351 Tinea unguium: Secondary | ICD-10-CM

## 2021-09-18 DIAGNOSIS — M79609 Pain in unspecified limb: Secondary | ICD-10-CM

## 2021-09-18 DIAGNOSIS — E1142 Type 2 diabetes mellitus with diabetic polyneuropathy: Secondary | ICD-10-CM

## 2021-09-18 NOTE — Progress Notes (Signed)
This patient returns to my office for at risk foot care.  This patient requires this care by a professional since this patient will be at risk due to having diabetes.  This patient is unable to cut nails himself since the patient cannot reach his nails.These nails are painful walking and wearing shoes.  This patient presents for at risk foot care today.  General Appearance  Alert, conversant and in no acute stress.  Vascular  Dorsalis pedis and posterior tibial  pulses are palpable  bilaterally.  Capillary return is within normal limits  bilaterally. Temperature is within normal limits  bilaterally.  Neurologic  Senn-Weinstein monofilament wire test diminished  bilaterally. Muscle power within normal limits bilaterally.  Nails Thick disfigured discolored nails with subungual debris  from hallux to fifth toes bilaterally. No evidence of bacterial infection or drainage bilaterally.  Orthopedic  No limitations of motion  feet .  No crepitus or effusions noted.  No bony pathology or digital deformities noted.  Skin  normotropic skin with no porokeratosis noted bilaterally.  No signs of infections or ulcers noted.  Asymptomatic 5th metabase callus plantarly.   Onychomycosis  Pain in right toes  Pain in left toes  Consent was obtained for treatment procedures.   Mechanical debridement of nails 1-5  bilaterally performed with a nail nipper.  Filed with dremel without incident.    Return office visit   3 months                  Told patient to return for periodic foot care and evaluation due to potential at risk complications.   Helane Gunther DPM

## 2021-10-12 ENCOUNTER — Ambulatory Visit: Payer: Medicare Other | Admitting: Podiatry

## 2021-12-06 ENCOUNTER — Other Ambulatory Visit (HOSPITAL_BASED_OUTPATIENT_CLINIC_OR_DEPARTMENT_OTHER): Payer: Self-pay

## 2021-12-06 DIAGNOSIS — Z23 Encounter for immunization: Secondary | ICD-10-CM | POA: Diagnosis not present

## 2021-12-06 MED ORDER — COVID-19 MRNA 2023-2024 VACCINE (COMIRNATY) 0.3 ML INJECTION
0.3000 mL | Freq: Once | INTRAMUSCULAR | 0 refills | Status: AC
  Filled 2021-12-06: qty 0.3, 1d supply, fill #0

## 2021-12-07 DIAGNOSIS — E1142 Type 2 diabetes mellitus with diabetic polyneuropathy: Secondary | ICD-10-CM | POA: Diagnosis not present

## 2021-12-07 DIAGNOSIS — Z1211 Encounter for screening for malignant neoplasm of colon: Secondary | ICD-10-CM | POA: Diagnosis not present

## 2021-12-07 DIAGNOSIS — Z794 Long term (current) use of insulin: Secondary | ICD-10-CM | POA: Diagnosis not present

## 2021-12-07 DIAGNOSIS — F419 Anxiety disorder, unspecified: Secondary | ICD-10-CM | POA: Diagnosis not present

## 2021-12-07 DIAGNOSIS — D509 Iron deficiency anemia, unspecified: Secondary | ICD-10-CM | POA: Diagnosis not present

## 2021-12-07 DIAGNOSIS — D649 Anemia, unspecified: Secondary | ICD-10-CM | POA: Diagnosis not present

## 2021-12-07 DIAGNOSIS — I10 Essential (primary) hypertension: Secondary | ICD-10-CM | POA: Diagnosis not present

## 2021-12-18 ENCOUNTER — Ambulatory Visit: Payer: Medicare Other | Admitting: Podiatry

## 2021-12-19 ENCOUNTER — Ambulatory Visit: Payer: Medicare Other | Admitting: Podiatry

## 2021-12-20 ENCOUNTER — Ambulatory Visit (INDEPENDENT_AMBULATORY_CARE_PROVIDER_SITE_OTHER): Payer: Medicare Other | Admitting: Podiatry

## 2021-12-20 DIAGNOSIS — M79609 Pain in unspecified limb: Secondary | ICD-10-CM | POA: Diagnosis not present

## 2021-12-20 DIAGNOSIS — E1142 Type 2 diabetes mellitus with diabetic polyneuropathy: Secondary | ICD-10-CM

## 2021-12-20 DIAGNOSIS — L84 Corns and callosities: Secondary | ICD-10-CM

## 2021-12-20 DIAGNOSIS — B351 Tinea unguium: Secondary | ICD-10-CM

## 2021-12-20 NOTE — Progress Notes (Signed)
  Subjective:  Patient ID: Blake Johnston, male    DOB: [birth year],  MRN: [id number]  Chief Complaint  Patient presents with   routine foot care     RFC- split nail on the left great hallux    76 y.o. male presents with the above complaint. History confirmed with patient. Patient presenting with pain related to dystrophic thickened elongated nails. Patient is unable to trim own nails related to nail dystrophy and/or mobility issues. Patient does have a history of T2DM. Patient does have callus present located at the plantar aspect of the left foot causing pain. He also reports a splint in the right hallux nail, thinks he snagged the toe on carpet, denies pain or drainage.   Objective:  Physical Exam: warm, good capillary refill nail exam onychomycosis of the toenails, onycholysis, and dystrophic nails. R hallux nail with longitudinal splint 2/2 onychomycosis DP pulses palpable, PT pulses palpable, and protective sensation intact Left Foot:  Pain with palpation of nails due to elongation and dystrophic growth. Hyperkeratotic lesion sub 5th met head left foot.  Right Foot: Pain with palpation of nails due to elongation and dystrophic growth.   Assessment:   1. Pain due to onychomycosis of nail   2. Pre-ulcerative calluses   3. Diabetic peripheral neuropathy associated with type 2 diabetes mellitus ([place])      Plan:  Patient was evaluated and treated and all questions answered.  #Hyperkeratotic lesions/pre ulcerative calluses present plantar lateral left midfoot sub 5th met head All symptomatic hyperkeratoses x 1 separate lesions were safely debrided with a sterile #10 blade to patient's level of comfort without incident. We discussed preventative and palliative care of these lesions including supportive and accommodative shoegear, padding, prefabricated and custom molded accommodative orthoses, use of a pumice stone and lotions/creams daily.  #Onychomycosis with pain  -Nails  palliatively debrided as below. -Educated on self-care  Procedure: Nail Debridement Rationale: Pain Type of Debridement: manual, sharp debridement. Instrumentation: Nail nipper, rotary burr. Number of Nails: 10  No follow-ups on file.         Everitt Amber, DPM [facility] / [facility]

## 2022-03-21 ENCOUNTER — Ambulatory Visit: Payer: Medicare Other | Admitting: Podiatry

## 2022-04-18 ENCOUNTER — Ambulatory Visit (INDEPENDENT_AMBULATORY_CARE_PROVIDER_SITE_OTHER): Payer: Medicare Other | Admitting: Podiatry

## 2022-04-18 DIAGNOSIS — L84 Corns and callosities: Secondary | ICD-10-CM | POA: Diagnosis not present

## 2022-04-18 DIAGNOSIS — E1142 Type 2 diabetes mellitus with diabetic polyneuropathy: Secondary | ICD-10-CM

## 2022-04-18 DIAGNOSIS — B351 Tinea unguium: Secondary | ICD-10-CM | POA: Diagnosis not present

## 2022-04-18 DIAGNOSIS — M79676 Pain in unspecified toe(s): Secondary | ICD-10-CM | POA: Diagnosis not present

## 2022-04-18 NOTE — Progress Notes (Signed)
  Subjective:  Patient ID: Blake Johnston, male    DOB: March 18, 1945,  MRN: [id number]  Chief Complaint  Patient presents with   routine foot care     77 y.o. male presents with the above complaint. History confirmed with patient. Patient presenting with pain related to dystrophic thickened elongated nails. Patient is unable to trim own nails related to nail dystrophy and/or mobility issues. Patient does have a history of T2DM. Patient does have callus present located at the plantar aspect of the left foot causing pain.   Objective:  Physical Exam: warm, good capillary refill nail exam onychomycosis of the toenails, onycholysis, and dystrophic nails. R hallux nail with longitudinal splint 2/2 onychomycosis DP pulses palpable, PT pulses palpable, and protective sensation intact Left Foot:  Pain with palpation of nails due to elongation and dystrophic growth. Hyperkeratotic lesion sub 5th met head left foot.  Right Foot: Pain with palpation of nails due to elongation and dystrophic growth.   Assessment:   1. Pain due to onychomycosis of nail   2. Pre-ulcerative calluses   3. Diabetic peripheral neuropathy associated with type 2 diabetes mellitus       Plan:  Patient was evaluated and treated and all questions answered.  #Hyperkeratotic lesions/pre ulcerative calluses present plantar lateral left midfoot sub 5th met head All symptomatic hyperkeratoses x 1 separate lesions were safely debrided with a sterile #10 blade to patient's level of comfort without incident. We discussed preventative and palliative care of these lesions including supportive and accommodative shoegear, padding, prefabricated and custom molded accommodative orthoses, use of a pumice stone and lotions/creams daily.  #Onychomycosis with pain  -Nails palliatively debrided as below. -Educated on self-care  Procedure: Nail Debridement Rationale: Pain Type of Debridement: manual, sharp  debridement. Instrumentation: Nail nipper, rotary burr. Number of Nails: 10  Return in about 3 months (around 07/18/2022) for [facility].         Corinna Gab, DPM [facility] / [facility]

## 2022-07-25 ENCOUNTER — Ambulatory Visit (INDEPENDENT_AMBULATORY_CARE_PROVIDER_SITE_OTHER): Payer: Medicare Other | Admitting: Podiatry

## 2022-07-25 ENCOUNTER — Encounter: Payer: Self-pay | Admitting: Podiatry

## 2022-07-25 VITALS — BP 154/73 | HR 40

## 2022-07-25 DIAGNOSIS — L84 Corns and callosities: Secondary | ICD-10-CM

## 2022-07-25 DIAGNOSIS — M79609 Pain in unspecified limb: Secondary | ICD-10-CM

## 2022-07-25 DIAGNOSIS — E1142 Type 2 diabetes mellitus with diabetic polyneuropathy: Secondary | ICD-10-CM

## 2022-07-25 DIAGNOSIS — B351 Tinea unguium: Secondary | ICD-10-CM

## 2022-07-25 NOTE — Progress Notes (Signed)
  Subjective:  Patient ID: Blake Johnston, male    DOB: 28-Jan-1945,  MRN: [id number]  Chief Complaint  Patient presents with   Diabetes    "This is the regular toenail clipping."    77 y.o. male presents with the above complaint. History confirmed with patient. Patient presenting with pain related to dystrophic thickened elongated nails. Patient is unable to trim own nails related to nail dystrophy and/or mobility issues. Patient does have a history of T2DM. Patient does have callus present located at the plantar aspect of the left foot causing pain.   Objective:  Physical Exam: warm, good capillary refill nail exam onychomycosis of the toenails, onycholysis, and dystrophic nails. R hallux nail with longitudinal splint 2/2 onychomycosis DP pulses palpable, PT pulses palpable, and protective sensation intact Left Foot:  Pain with palpation of nails due to elongation and dystrophic growth. Hyperkeratotic lesion sub 5th met head left foot.  Right Foot: Pain with palpation of nails due to elongation and dystrophic growth.   Assessment:   1. Pain due to onychomycosis of nail   2. Pre-ulcerative calluses   3. Diabetic peripheral neuropathy associated with type 2 diabetes mellitus (HCC)        Plan:  Patient was evaluated and treated and all questions answered.  #Hyperkeratotic lesions/pre ulcerative calluses present plantar lateral left midfoot sub 5th met head All symptomatic hyperkeratoses x 1 separate lesions were safely debrided with a sterile #10 blade to patient's level of comfort without incident. We discussed preventative and palliative care of these lesions including supportive and accommodative shoegear, padding, prefabricated and custom molded accommodative orthoses, use of a pumice stone and lotions/creams daily.  #Onychomycosis with pain  -Nails palliatively debrided as below. -Educated on self-care  Procedure: Nail Debridement Rationale: Pain Type of Debridement:  manual, sharp debridement. Instrumentation: Nail nipper, rotary burr. Number of Nails: 10  Return in about 3 months (around 10/25/2022) for [facility].         Corinna Gab, DPM [facility] / [facility]

## 2022-10-25 ENCOUNTER — Ambulatory Visit (INDEPENDENT_AMBULATORY_CARE_PROVIDER_SITE_OTHER): Payer: Medicare Other | Admitting: Podiatry

## 2022-10-25 DIAGNOSIS — M79609 Pain in unspecified limb: Secondary | ICD-10-CM | POA: Diagnosis not present

## 2022-10-25 DIAGNOSIS — B351 Tinea unguium: Secondary | ICD-10-CM

## 2022-10-25 DIAGNOSIS — B353 Tinea pedis: Secondary | ICD-10-CM | POA: Diagnosis not present

## 2022-10-25 DIAGNOSIS — E1142 Type 2 diabetes mellitus with diabetic polyneuropathy: Secondary | ICD-10-CM

## 2022-10-25 DIAGNOSIS — L84 Corns and callosities: Secondary | ICD-10-CM

## 2022-10-25 MED ORDER — KETOCONAZOLE 2 % EX CREA: 1.0000 | TOPICAL_CREAM | Freq: Every day | CUTANEOUS | 0 refills | Status: AC

## 2022-10-25 NOTE — Progress Notes (Signed)
  Subjective:  Patient ID: Blake Johnston, male    DOB: [birth year],  MRN: [id number]  Chief Complaint  Patient presents with   Diabetes    dfc    77 y.o. male presents with the above complaint. History confirmed with patient. Patient presenting with pain related to dystrophic thickened elongated nails. Patient is unable to trim own nails related to nail dystrophy and/or mobility issues. Patient does have a history of T2DM. Patient does have callus present located at the plantar aspect of the left foot causing pain.   Objective:  Physical Exam: warm, good capillary refill nail exam onychomycosis of the toenails, onycholysis, and dystrophic nails. R hallux nail with longitudinal splint 2/2 onychomycosis dry peeling skin with rash present bilateral plantar foot DP pulses palpable, PT pulses palpable, and protective sensation intact Left Foot:  Pain with palpation of nails due to elongation and dystrophic growth. Hyperkeratotic lesion sub 5th met head left foot.  Right Foot: Pain with palpation of nails due to elongation and dystrophic growth.   Assessment:   1. Pain due to onychomycosis of nail   2. Pre-ulcerative calluses   3. Diabetic peripheral neuropathy associated with type 2 diabetes mellitus (HCC)   4. Tinea pedis of both feet      Plan:  Patient was evaluated and treated and all questions answered.  # Tinea pedis bilateral Discussed the etiology and treatment options for tinea pedis.  Discussed topical and oral treatment.  Recommended topical treatment with 2% ketoconazole cream.  This was sent to the patient's pharmacy.  Also discussed appropriate foot hygiene, use of antifungal spray such as Tinactin in shoes, as well as cleaning her foot surfaces such as showers and bathroom floors with bleach. -I certify that this diagnosis represents a distinct and separate diagnosis that requires evaluation and treatment separate from other procedures or diagnosis   #Onychomycosis  with pain  -Nails palliatively debrided as below. -Educated on self-care  Procedure: Nail Debridement Rationale: Pain Type of Debridement: manual, sharp debridement. Instrumentation: Nail nipper, rotary burr. Number of Nails: 10  Return in about 3 months (around 01/25/2023) for [facility].         Corinna Gab, DPM [facility] / [facility]

## 2022-10-28 ENCOUNTER — Other Ambulatory Visit (HOSPITAL_COMMUNITY): Payer: Self-pay | Admitting: Internal Medicine

## 2022-10-28 DIAGNOSIS — I7121 Aneurysm of the ascending aorta, without rupture: Secondary | ICD-10-CM

## 2022-10-28 DIAGNOSIS — Z23 Encounter for immunization: Secondary | ICD-10-CM | POA: Diagnosis not present

## 2022-10-28 DIAGNOSIS — I441 Atrioventricular block, second degree: Secondary | ICD-10-CM | POA: Diagnosis not present

## 2022-10-28 DIAGNOSIS — D509 Iron deficiency anemia, unspecified: Secondary | ICD-10-CM | POA: Diagnosis not present

## 2022-10-28 DIAGNOSIS — Z136 Encounter for screening for cardiovascular disorders: Secondary | ICD-10-CM | POA: Diagnosis not present

## 2022-10-28 DIAGNOSIS — I10 Essential (primary) hypertension: Secondary | ICD-10-CM | POA: Diagnosis not present

## 2022-10-28 DIAGNOSIS — F419 Anxiety disorder, unspecified: Secondary | ICD-10-CM | POA: Diagnosis not present

## 2022-10-28 DIAGNOSIS — E559 Vitamin D deficiency, unspecified: Secondary | ICD-10-CM | POA: Diagnosis not present

## 2022-10-28 DIAGNOSIS — Z0000 Encounter for general adult medical examination without abnormal findings: Secondary | ICD-10-CM | POA: Diagnosis not present

## 2022-10-28 DIAGNOSIS — Z79899 Other long term (current) drug therapy: Secondary | ICD-10-CM | POA: Diagnosis not present

## 2022-10-28 DIAGNOSIS — Z794 Long term (current) use of insulin: Secondary | ICD-10-CM | POA: Diagnosis not present

## 2022-10-28 DIAGNOSIS — E1142 Type 2 diabetes mellitus with diabetic polyneuropathy: Secondary | ICD-10-CM | POA: Diagnosis not present

## 2022-10-28 DIAGNOSIS — F410 Panic disorder [episodic paroxysmal anxiety] without agoraphobia: Secondary | ICD-10-CM | POA: Diagnosis not present

## 2022-10-31 ENCOUNTER — Telehealth (HOSPITAL_COMMUNITY): Payer: Self-pay | Admitting: Internal Medicine

## 2022-10-31 NOTE — Telephone Encounter (Signed)
Just an FYI. We have made several attempts to contact this patient including sending a letter to schedule or reschedule their echocardiogram. We will be removing the patient from the echo WQ.   10/31/22 Spoke with Amy Jumpe at [place] and informed we were unable to reach patient to schedule and she will note in chart. LBW 11:09  10/31/22 LM w Amy at [facility] @ 9:56/LBW  10/30/22 [facility] to schedule x 3 @ 11:29/LBW  10/29/22 [facility] to schedule @ 2:03/LBW  10/28/22 LVM on cell and HM# to call office to schedule @ 11:42/LBW        Thank you

## 2022-11-15 DIAGNOSIS — I7121 Aneurysm of the ascending aorta, without rupture: Secondary | ICD-10-CM | POA: Diagnosis not present

## 2022-11-15 DIAGNOSIS — H6123 Impacted cerumen, bilateral: Secondary | ICD-10-CM | POA: Diagnosis not present

## 2022-11-18 ENCOUNTER — Other Ambulatory Visit (HOSPITAL_COMMUNITY): Payer: Medicare Other

## 2022-11-18 ENCOUNTER — Encounter (HOSPITAL_COMMUNITY): Payer: Self-pay

## 2022-12-26 DIAGNOSIS — H35341 Macular cyst, hole, or pseudohole, right eye: Secondary | ICD-10-CM | POA: Diagnosis not present

## 2022-12-26 DIAGNOSIS — H33312 Horseshoe tear of retina without detachment, left eye: Secondary | ICD-10-CM | POA: Diagnosis not present

## 2022-12-26 DIAGNOSIS — H33021 Retinal detachment with multiple breaks, right eye: Secondary | ICD-10-CM | POA: Diagnosis not present

## 2023-01-24 ENCOUNTER — Ambulatory Visit: Payer: Medicare Other | Admitting: Podiatry

## 2023-05-07 ENCOUNTER — Ambulatory Visit: Admitting: Podiatrist

## 2023-05-07 ENCOUNTER — Encounter: Payer: Self-pay | Admitting: Podiatrist

## 2023-05-07 ENCOUNTER — Ambulatory Visit (INDEPENDENT_AMBULATORY_CARE_PROVIDER_SITE_OTHER): Admitting: Podiatrist

## 2023-05-07 DIAGNOSIS — B353 Tinea pedis: Secondary | ICD-10-CM | POA: Diagnosis not present

## 2023-05-07 DIAGNOSIS — M79609 Pain in unspecified limb: Secondary | ICD-10-CM | POA: Diagnosis not present

## 2023-05-07 DIAGNOSIS — E1142 Type 2 diabetes mellitus with diabetic polyneuropathy: Secondary | ICD-10-CM | POA: Diagnosis not present

## 2023-05-07 DIAGNOSIS — B351 Tinea unguium: Secondary | ICD-10-CM

## 2023-05-07 MED ORDER — TERBINAFINE HCL 250 MG PO TABS: 250.0000 mg | ORAL_TABLET | Freq: Every day | ORAL | 0 refills | Status: DC

## 2023-05-07 NOTE — Progress Notes (Signed)
 Chief Complaint  Patient presents with   Nail Problem    DFC and Athletes feet. Ketoconazole  cream prescribed. Black toe nails bilateral and tingling teet.     HPI: Patient is 78 y.o. male who presents today for athletes foot and routine foot care.  He relates he has darkening of the toenails and the feet tingle.     No Known Allergies  Review of systems is negative except as noted in the HPI.  Denies nausea/ vomiting/ fevers/ chills or night sweats.   Denies difficulty breathing, denies calf pain or tenderness  Physical Exam  Patient is awake, alert, and oriented x 3.  In no acute distress.    Vascular status is intact with palpable pedal pulses DP and PT bilateral and capillary refill time less than 3 seconds bilateral.  No edema or erythema noted.   Neurological exam reveals dereased epicritic and protective sensation bilateral 3/5 sites via SWMF 5.07  Dermatological exam reveals peeling itching skin to bilateral feet.  He has tried ketoconazole  which has failed to resolve this issue.   Nails are thick, discolored and elongated clinically mycotic x 10.    Musculoskeletal exam: Musculature intact with dorsiflexion, plantarflexion, inversion, eversion. Ankle and First MPJ joint range of motion normal.   Labs assessed in Epic in the media tab and 11/18/22 documentation showed liver function is normal.    Assessment:   ICD-10-CM   1. Pain due to onychomycosis of nail  B35.1    M79.609     2. Tinea pedis of both feet  B35.3     3. Diabetic peripheral neuropathy associated with type 2 diabetes mellitus (HCC)  E11.42        Plan: Debriement of toenails performed with sterile nail nipper and power burr.  Recommended short term course of lamisil.  Labs assessed and they are good.  Will call in lamisil.  He will return in 3 months for routine care.

## 2023-06-18 ENCOUNTER — Ambulatory Visit: Admitting: Cardiology

## 2023-06-19 ENCOUNTER — Ambulatory Visit: Admitting: Cardiology

## 2023-07-03 DIAGNOSIS — H2512 Age-related nuclear cataract, left eye: Secondary | ICD-10-CM | POA: Insufficient documentation

## 2023-08-04 ENCOUNTER — Encounter (INDEPENDENT_AMBULATORY_CARE_PROVIDER_SITE_OTHER): Payer: Self-pay | Admitting: Physician Assistant

## 2023-08-04 ENCOUNTER — Ambulatory Visit (INDEPENDENT_AMBULATORY_CARE_PROVIDER_SITE_OTHER): Admitting: Physician Assistant

## 2023-08-04 VITALS — BP 155/82 | HR 44

## 2023-08-04 DIAGNOSIS — H6123 Impacted cerumen, bilateral: Secondary | ICD-10-CM

## 2023-08-04 NOTE — Progress Notes (Signed)
 Dear Dr. Charlott, Here is my assessment for our mutual patient, Blake Johnston . Thank you for allowing me the opportunity to care for your patient. Please do not hesitate to contact me should you have any other questions. Sincerely, Chyrl Cohen PA-C  Otolaryngology Clinic Note Referring provider: Dr. Charlott HPI:  Blake Johnston  is a 78 y.o. male kindly referred by Dr. Charlott   The patient this a 78 year old gentleman seen in our office for evaluation of cerumen impaction.  The patient notes that he saw his primary care provider noted cerumen impaction.  He has had irrigation previously which she could not tolerate.  He notes that when he moves his external ear he can hear a little bit better.  He denies any drainage, no pain.  He notes an ongoing history of cerumen impaction.   Independent Review of Additional Tests or Records:  None   PMH/Meds/All/SocHx/FamHx/ROS:   Past Medical History:  Diagnosis Date   Abnormal EKG    Allergic rhinitis    Detached retina, right    DM (diabetes mellitus) (HCC)    ED (erectile dysfunction)    FAILED MEDS AND INJECTIONS   Elevated IOP, right    EYE   Esophageal reflux    Hypertension    Microcytic anemia    Onychomycosis      Past Surgical History:  Procedure Laterality Date   APPENDECTOMY     CATARACT EXTRACTION     RETINAL PUCKLER REPAIR      Family History  Problem Relation Age of Onset   Diabetes Mother    Bleeding Disorder Father    Cancer Sister        OVARIAN   Cancer Brother        LIVER   Cancer Brother        STOMACH   Cirrhosis Brother    Sudden death Sister      Social Connections: Not on file      Current Outpatient Medications:    amLODipine (NORVASC) 10 MG tablet, , Disp: , Rfl:    Ascorbic Acid (VITAMIN C) 500 MG CAPS, See admin instructions., Disp: , Rfl:    aspirin EC 81 MG tablet, Take 81 mg by mouth daily., Disp: , Rfl:    carvedilol  (COREG ) 6.25 MG tablet, TAKE 1 TABLET BY MOUTH  TWICE DAILY WITH MEALS . APPOINTMENT REQUIRED FOR FUTURE REFILLS, Disp: 15 tablet, Rfl: 0   chlorthalidone (HYGROTON) 25 MG tablet, Take 25 mg by mouth every morning., Disp: , Rfl:    Cholecalciferol (VITAMIN D) 50 MCG (2000 UT) tablet, 1 tablet, Disp: , Rfl:    hydrochlorothiazide  (MICROZIDE ) 12.5 MG capsule, Take 1 capsule (12.5 mg total) by mouth daily. Please schedule annual appt with Dr. Pietro for refills. [phone]. 1st attempt., Disp: 30 capsule, Rfl: 0   ID NOW COVID-19 KIT, TEST AS DIRECTED TODAY, Disp: , Rfl:    Insulin Pen Needle (PEN NEEDLES) 32G X 4 MM MISC, use with insulin pen to inject insulin once daily, Disp: , Rfl:    irbesartan (AVAPRO) 300 MG tablet, Take 300 mg by mouth daily., Disp: , Rfl:    ketoconazole  (NIZORAL ) 2 % cream, Apply 1 Application topically daily., Disp: 60 g, Rfl: 0   LORazepam (ATIVAN) 1 MG tablet, Take 1 mg by mouth 2 (two) times daily., Disp: , Rfl:    metFORMIN (GLUCOPHAGE) 1000 MG tablet, Take 1,000 mg by mouth 2 (two) times daily with a meal. , Disp: , Rfl:    Multiple Vitamin (  MULTIVITAMINS PO), 1 tablet, Disp: , Rfl:    Omega-3 Fatty Acids (FISH OIL) 1000 MG CAPS, Take by mouth., Disp: , Rfl:    rosuvastatin (CRESTOR) 5 MG tablet, 1 tablet, Disp: , Rfl:    sertraline (ZOLOFT) 50 MG tablet, Take by mouth., Disp: , Rfl:    terbinafine  (LAMISIL ) 250 MG tablet, Take 1 tablet (250 mg total) by mouth daily., Disp: 21 tablet, Rfl: 0   timolol (TIMOPTIC) 0.5 % ophthalmic solution, Place 1 drop into the right eye 2 times daily., Disp: , Rfl:    timolol (TIMOPTIC) 0.5 % ophthalmic solution, Place 1 drop into the right eye 2 (two) times daily., Disp: , Rfl:    TRESIBA FLEXTOUCH 100 UNIT/ML SOPN FlexTouch Pen, , Disp: , Rfl:    TRULICITY 1.5 MG/0.5ML SOPN, once a week. , Disp: , Rfl:    vitamin B-12 (CYANOCOBALAMIN) 100 MCG tablet, Take 100 mcg by mouth daily., Disp: , Rfl:    Zinc 50 MG TABS, 1 tablet, Disp: , Rfl:    COVID-19 mRNA bivalent vaccine,  Pfizer, injection, Inject into the muscle. (Patient not taking: Reported on 08/04/2023), Disp: 0.3 mL, Rfl: 0   COVID-19 mRNA Vac-TriS, Pfizer, (PFIZER-BIONT COVID-19 VAC-TRIS) SUSP injection, Inject into the muscle. (Patient not taking: Reported on 08/04/2023), Disp: 0.3 mL, Rfl: 0   Physical Exam:   BP (!) 155/82   Pulse (!) 44   SpO2 98%   Pertinent Findings  CN II-XII intact Bilateral cerumen impaction Unable to complete remainder of exam due to patient anxiety  Seprately Identifiable Procedures:  Procedure: Bilateral ear microscopy and cerumen removal using microscope (CPT [phone]) - Mod 50 Pre-procedure diagnosis: bilateral cerumen impaction external auditory canals Post-procedure diagnosis: same Indication: bilateral cerumen impaction; given patient's otologic complaints and history as well as for improved and comprehensive examination of external ear and tympanic membrane, bilateral otologic examination using microscope was performed and impacted cerumen removed  Procedure: Patient was placed semi-recumbent. Both ear canals were examined using the microscope with findings above. Cerumen removed from bilateral external auditory canals using suction and currette with improvement in EAC examination and patency. Left: EAC was patent. TM was intact . Middle ear was aerated. Drainage: none Right: EAC was patent. TM was intact . Middle ear was aerated . Drainage: none Patient tolerated the procedure well.   Impression & Plans:  Blake Johnston  is a 78 y.o. male with the following   Cerumen impaction-  78 year old gentleman seen today for cerumen impaction.  Upon initial assessment and attempt removing the impaction he became very anxious, he had to leave the building, he did come back into the building and I was able to complete the cerumen.  I was unable to complete the remainder of the exam as the patient was having anxiety.  He reports his hearing is back to baseline, but happy to  see him back in the office in 1 year for repeat evaluation or sooner as needed.  The patient and his daughter verbalized understanding and agreement to today's plan had no further questions or concerns.   - f/u 1 year   Thank you for allowing me the opportunity to care for your patient. Please do not hesitate to contact me should you have any other questions.  Sincerely, Chyrl Cohen PA-C [facility] Phone: [phone] Fax: [phone]  08/04/2023, 1:10 PM

## 2023-08-13 ENCOUNTER — Encounter: Payer: Self-pay | Admitting: Podiatry

## 2023-08-13 ENCOUNTER — Ambulatory Visit (INDEPENDENT_AMBULATORY_CARE_PROVIDER_SITE_OTHER): Admitting: Podiatry

## 2023-08-13 DIAGNOSIS — E1142 Type 2 diabetes mellitus with diabetic polyneuropathy: Secondary | ICD-10-CM

## 2023-08-13 DIAGNOSIS — B351 Tinea unguium: Secondary | ICD-10-CM

## 2023-08-13 DIAGNOSIS — L84 Corns and callosities: Secondary | ICD-10-CM | POA: Diagnosis not present

## 2023-08-13 DIAGNOSIS — M79609 Pain in unspecified limb: Secondary | ICD-10-CM | POA: Diagnosis not present

## 2023-08-18 NOTE — Progress Notes (Signed)
  Subjective:  Patient ID: Blake Johnston , male    DOB: 10/21/1945,  MRN: [id number]  Blake Johnston  presents to clinic today for preventative diabetic foot care and callus(es) left foot and painful mycotic toenails that are difficult to trim. Painful toenails interfere with ambulation. Aggravating factors include wearing enclosed shoe gear. Pain is relieved with periodic professional debridement. Painful calluses are aggravated when weightbearing with and without shoegear. Pain is relieved with periodic professional debridement.  Chief Complaint  Patient presents with   Diabetes    DFC IDDM A1C 7.0. Toenail trim and callus care. LOV with PCP 07/2023.   New problem(s): None.   PCP is Blake Dorn LABOR, Blake Johnston.  No Known Allergies  Review of Systems: Negative except as noted in the HPI.  Objective: No changes noted in today's physical examination. There were no vitals filed for this visit. Blake Johnston  is a pleasant 78 y.o. male in NAD. AAO x 3.  Vascular Examination: Capillary refill time immediate b/l. Vascular status intact b/l with palpable pedal pulses. Pedal hair present b/l. No pain with calf compression b/l. Skin temperature gradient WNL b/l. No cyanosis or clubbing b/l. No ischemia or gangrene noted b/l.   Neurological Examination: Protective sensation decreased with 10 gram monofilament b/l.  Dermatological Examination: Pedal skin with normal turgor, texture and tone b/l.  No open wounds. No interdigital macerations.   Toenails 1-5 b/l thick, discolored, elongated with subungual debris and pain on dorsal palpation.   Hyperkeratotic lesion(s) sub 5th met base left foot.  No erythema, no edema, no drainage, no fluctuance.  Musculoskeletal Examination: Muscle strength 5/5 to all lower extremity muscle groups bilaterally. Plantarflexed metatarsal(s) 5th metatarsal head left foot.  Radiographs: None  Assessment/Plan: 1. Pain due to onychomycosis of nail    2. Pre-ulcerative calluses   3. Diabetic peripheral neuropathy associated with type 2 diabetes mellitus (HCC)   -Patient was evaluated today. All questions/concerns addressed on today's visit. -Patient to continue soft, supportive shoe gear daily. -Toenails 1-5 b/l were debrided in length and girth with sterile nail nippers and dremel without iatrogenic bleeding. Treatment was provided by assistant Blake Johnston under my supervision.  -Callus(es) sub 5th met base left foot pared utilizing rotary bur without complication or incident. Total number pared =1. -Patient/POA to call should there be question/concern in the interim.   Return in about 3 months (around 11/13/2023).  Blake Johnston, DPM      [facility] LOCATION: [address] [place] [address]                   Office [phone]   [facility] LOCATION: [address] [place], [place] [address] Office [phone]

## 2023-09-01 ENCOUNTER — Encounter: Payer: Self-pay | Admitting: Cardiology

## 2023-09-01 ENCOUNTER — Ambulatory Visit: Attending: Cardiology | Admitting: Cardiology

## 2023-09-01 ENCOUNTER — Telehealth: Payer: Self-pay | Admitting: Home Health

## 2023-09-01 ENCOUNTER — Ambulatory Visit: Attending: Cardiology

## 2023-09-01 VITALS — BP 158/70 | HR 43 | Resp 18 | Ht 75.0 in | Wt 226.8 lb

## 2023-09-01 DIAGNOSIS — F410 Panic disorder [episodic paroxysmal anxiety] without agoraphobia: Secondary | ICD-10-CM | POA: Insufficient documentation

## 2023-09-01 DIAGNOSIS — E1142 Type 2 diabetes mellitus with diabetic polyneuropathy: Secondary | ICD-10-CM | POA: Diagnosis not present

## 2023-09-01 DIAGNOSIS — J309 Allergic rhinitis, unspecified: Secondary | ICD-10-CM | POA: Insufficient documentation

## 2023-09-01 DIAGNOSIS — R0609 Other forms of dyspnea: Secondary | ICD-10-CM | POA: Insufficient documentation

## 2023-09-01 DIAGNOSIS — I441 Atrioventricular block, second degree: Secondary | ICD-10-CM

## 2023-09-01 DIAGNOSIS — N5201 Erectile dysfunction due to arterial insufficiency: Secondary | ICD-10-CM | POA: Insufficient documentation

## 2023-09-01 DIAGNOSIS — F419 Anxiety disorder, unspecified: Secondary | ICD-10-CM | POA: Insufficient documentation

## 2023-09-01 DIAGNOSIS — Z794 Long term (current) use of insulin: Secondary | ICD-10-CM | POA: Insufficient documentation

## 2023-09-01 DIAGNOSIS — I10 Essential (primary) hypertension: Secondary | ICD-10-CM | POA: Insufficient documentation

## 2023-09-01 DIAGNOSIS — G4700 Insomnia, unspecified: Secondary | ICD-10-CM | POA: Insufficient documentation

## 2023-09-01 DIAGNOSIS — K219 Gastro-esophageal reflux disease without esophagitis: Secondary | ICD-10-CM | POA: Insufficient documentation

## 2023-09-01 DIAGNOSIS — D509 Iron deficiency anemia, unspecified: Secondary | ICD-10-CM | POA: Insufficient documentation

## 2023-09-01 DIAGNOSIS — H40021 Open angle with borderline findings, high risk, right eye: Secondary | ICD-10-CM | POA: Insufficient documentation

## 2023-09-01 DIAGNOSIS — K5900 Constipation, unspecified: Secondary | ICD-10-CM | POA: Insufficient documentation

## 2023-09-01 NOTE — Telephone Encounter (Signed)
 [employer] called after hours line today, reporting patient had 3 back-to-back episodes of complete heart block, with heart rate 33 to 53 bpm, lasting total 2 minutes in duration, occurred today at 443pm to 455pm.  He was noted with underlying second-degree AV block type II with average heart rate 39 bpm.   Called patient tonight, he states he was laying down and trying to sleep around 443-455pm, denied experiencing any significant chest pain, shortness of breath, dizziness, syncope, fatigue, weakness.  He states he feels well.  He did take carvedilol  this morning and was told to stop it going forward.  Advised patient to go to the ER if he develops significant symptomatic bradycardia, otherwise complete ZIO monitor study and follow-up with Dr. Bernie.  He voiced understanding.  Will forward this message to Dr. Krasowski to determine if EP referral is needed.

## 2023-09-01 NOTE — Progress Notes (Unsigned)
 Cardiology Consultation:    Date:  09/01/2023   ID:  Blake Johnston , DOB [birth year], MRN [id number]  PCP:  Charlott Dorn LABOR, MD  Cardiologist:  Lamar Fitch, MD   Referring MD: Charlott Dorn LABOR, *   Chief Complaint  Patient presents with   Establish Care    History of Present Illness:    Blake Johnston  is a 78 y.o. male who is being seen today for the evaluation of heart block at the request of Charlott Dorn LABOR, *.  Past medical history significant for diabetes, essential hypertension, dyslipidemia.  He was referred to us  because he was noted to have slow heart rate and apparently there is diagnosis of second-degree AV block already in the computer.  He said he is doing fine he is asymptomatic he can walk climb stairs with no difficulties he there is no changes in his ability to exercise now compared to previously.  Denies having any swelling of lower extremities no paroxysmal medical dyspnea basically is asymptomatic he is kind of surprised he is not sure exactly why he is in my office.  He does not have nightmares.  Past Medical History:  Diagnosis Date   Abnormal EKG    Allergic rhinitis    Detached retina, right    DM (diabetes mellitus) (HCC)    ED (erectile dysfunction)    FAILED MEDS AND INJECTIONS   Elevated IOP, right    EYE   Esophageal reflux    Hypertension    Microcytic anemia    Onychomycosis     Past Surgical History:  Procedure Laterality Date   APPENDECTOMY     CATARACT EXTRACTION     RETINAL PUCKLER REPAIR      Current Medications: Current Meds  Medication Sig   amLODipine (NORVASC) 10 MG tablet    Ascorbic Acid (VITAMIN C) 500 MG CAPS See admin instructions.   aspirin EC 81 MG tablet Take 81 mg by mouth daily.   chlorthalidone (HYGROTON) 25 MG tablet Take 25 mg by mouth every morning.   Cholecalciferol (VITAMIN D) 50 MCG (2000 UT) tablet 1 tablet   hydrochlorothiazide  (MICROZIDE ) 12.5 MG capsule Take 1 capsule  (12.5 mg total) by mouth daily. Please schedule annual appt with Dr. Pietro for refills. [phone]. 1st attempt.   Insulin Pen Needle (PEN NEEDLES) 32G X 4 MM MISC use with insulin pen to inject insulin once daily   irbesartan (AVAPRO) 300 MG tablet Take 300 mg by mouth daily.   LORazepam (ATIVAN) 1 MG tablet Take 1 mg by mouth 2 (two) times daily.   metFORMIN (GLUCOPHAGE) 1000 MG tablet Take 1,000 mg by mouth 2 (two) times daily with a meal.    Multiple Vitamin (MULTIVITAMINS PO) 1 tablet   Multiple Vitamin (MULTIVITAMINS PO) Take 1 tablet by mouth daily in the afternoon.   rosuvastatin (CRESTOR) 5 MG tablet 1 tablet   sertraline (ZOLOFT) 50 MG tablet Take by mouth.   terbinafine  (LAMISIL ) 250 MG tablet Take 1 tablet (250 mg total) by mouth daily.   TRESIBA FLEXTOUCH 100 UNIT/ML SOPN FlexTouch Pen    TRULICITY 1.5 MG/0.5ML SOPN once a week.    vitamin B-12 (CYANOCOBALAMIN) 100 MCG tablet Take 100 mcg by mouth daily.   zinc gluconate 50 MG tablet Take 1 tablet by mouth daily.   [DISCONTINUED] carvedilol  (COREG ) 6.25 MG tablet TAKE 1 TABLET BY MOUTH TWICE DAILY WITH MEALS . APPOINTMENT REQUIRED FOR FUTURE REFILLS   [DISCONTINUED] sitaGLIPtin-metformin (JANUMET) 50-1000 MG tablet Take 1  tablet by mouth 2 (two) times daily with a meal.     Allergies:   Patient has no known allergies.   Social History   Socioeconomic History   Marital status: Married    Spouse name: Not on file   Number of children: 5   Years of education: Not on file   Highest education level: Not on file  Occupational History   Not on file  Tobacco Use   Smoking status: Former    Current packs/day: 0.00    Types: Cigarettes    Quit date: 10/16/1965    Years since quitting: 57.9   Smokeless tobacco: Never  Vaping Use   Vaping status: Never Used  Substance and Sexual Activity   Alcohol use: Never   Drug use: Never   Sexual activity: Not on file    Comment: MARRIED  Other Topics Concern   Not on file   Social History Narrative   Not on file   Social Drivers of Health   Financial Resource Strain: Not on file  Food Insecurity: Not on file  Transportation Needs: Not on file  Physical Activity: Not on file  Stress: Not on file  Social Connections: Not on file     Family History: The patient's family history includes Bleeding Disorder in his father; Cancer in his brother, brother, and sister; Cirrhosis in his brother; Diabetes in his mother; Sudden death in his sister. ROS:   Please see the history of present illness.    All 14 point review of systems negative except as described per history of present illness.  EKGs/Labs/Other Studies Reviewed:    The following studies were reviewed today:   EKG:     Normal sinus rhythm, 2:1 AV conduction low voltage EKG  Recent Labs: No results found for requested labs within last 365 days.  Recent Lipid Panel No results found for: CHOL, TRIG, HDL, CHOLHDL, VLDL, LDLCALC, LDLDIRECT  Physical Exam:    VS:  BP (!) 158/70 (BP Location: Right Arm, Patient Position: Sitting, Cuff Size: Normal)   Pulse (!) 43   Resp 18   Ht 6' 3 (1.905 m)   Wt 226 lb 12 oz (102.9 kg)   SpO2 98%   BMI 28.34 kg/m     Wt Readings from Last 3 Encounters:  09/01/23 226 lb 12 oz (102.9 kg)  03/13/18 232 lb (105.2 kg)  12/08/17 239 lb (108.4 kg)     GEN:  Well nourished, well developed in no acute distress HEENT: Normal NECK: No JVD; No carotid bruits LYMPHATICS: No lymphadenopathy CARDIAC: RRR, no murmurs, no rubs, no gallops RESPIRATORY:  Clear to auscultation without rales, wheezing or rhonchi  ABDOMEN: Soft, non-tender, non-distended MUSCULOSKELETAL:  No edema; No deformity  SKIN: Warm and dry NEUROLOGIC:  Alert and oriented x 3 PSYCHIATRIC:  Normal affect   ASSESSMENT:    1. Mobitz type 1 second degree AV block   2. Dyspnea on exertion   3. Second degree AV block   4. Essential hypertension   5. Type 2 diabetes mellitus with  diabetic polyneuropathy, with long-term current use of insulin (HCC)    PLAN:    In order of problems listed above:  Second-degree AV block, difficult to say if this is type I or type II, he is taking carvedilol  which I will ask him to discontinue we will put Zio patch AT on him to see if there is recovery of this rhythm likely he is absolutely completely asymptomatic.  He will continue  monitoring. Dyspnea on exertion: Will schedule him to have echocardiogram to assess left ventricle ejection fraction. Essential hypertension blood pressure slightly elevated first visit in my office anticipate him to have some increase in the blood pressure when we discontinue his carvedilol  but I see him rather shortly within a month and the adjustment of the medication will be made.  Hopefully his conduction system will recover and he will not require pacemaker. Type 2 diabetes followed by antimedicine team stable   Medication Adjustments/Labs and Tests Ordered: Current medicines are reviewed at length with the patient today.  Concerns regarding medicines are outlined above.  Orders Placed This Encounter  Procedures   LONG TERM MONITOR-LIVE TELEMETRY (3-14 DAYS)   EKG 12-Lead   ECHOCARDIOGRAM COMPLETE   No orders of the defined types were placed in this encounter.   Signed, Lamar DOROTHA Fitch, MD, [facility]. 09/01/2023 4:46 PM    [facility]

## 2023-09-01 NOTE — Patient Instructions (Signed)
 Medication Instructions:   STOP: Carvedilol    Lab Work: None Ordered If you have labs (blood work) drawn today and your tests are completely normal, you will receive your results only by: MyChart Message (if you have MyChart) OR A paper copy in the mail If you have any lab test that is abnormal or we need to change your treatment, we will call you to review the results.   Testing/Procedures: Your physician has requested that you have an echocardiogram. Echocardiography is a painless test that uses sound waves to create images of your heart. It provides your doctor with information about the size and shape of your heart and how well your heart's chambers and valves are working. This procedure takes approximately one hour. There are no restrictions for this procedure. Please do NOT wear cologne, perfume, aftershave, or lotions (deodorant is allowed). Please arrive 15 minutes prior to your appointment time.  Please note: We ask at that you not bring children with you during ultrasound (echo/ vascular) testing. Due to room size and safety concerns, children are not allowed in the ultrasound rooms during exams. Our front office staff cannot provide observation of children in our lobby area while testing is being conducted. An adult accompanying a patient to their appointment will only be allowed in the ultrasound room at the discretion of the ultrasound technician under special circumstances. We apologize for any inconvenience.    WHY IS MY DOCTOR PRESCRIBING ZIO? The Zio system is proven and trusted by physicians to detect and diagnose irregular heart rhythms -- and has been prescribed to hundreds of thousands of patients.  The FDA has cleared the Zio system to monitor for many different kinds of irregular heart rhythms. In a study, physicians were able to reach a diagnosis 90% of the time with the Zio system1.  You can wear the Zio monitor -- a small, discreet, comfortable patch -- during your  normal day-to-day activity, including while you sleep, shower, and exercise, while it records every single heartbeat for analysis.  1Barrett, P., et al. Comparison of 24 Hour Holter Monitoring Versus 14 Day Novel Adhesive Patch Electrocardiographic Monitoring. American Journal of Medicine, 2014.  ZIO VS. HOLTER MONITORING The Zio monitor can be comfortably worn for up to 14 days. Holter monitors can be worn for 24 to 48 hours, limiting the time to record any irregular heart rhythms you may have. Zio is able to capture data for the 51% of patients who have their first symptom-triggered arrhythmia after 48 hours.1  LIVE WITHOUT RESTRICTIONS The Zio ambulatory cardiac monitor is a small, unobtrusive, and water-resistant patch--you might even forget you're wearing it. The Zio monitor records and stores every beat of your heart, whether you're sleeping, working out, or showering.     Follow-Up: At [facility], you and your health needs are our priority.  As part of our continuing mission to provide you with exceptional heart care, we have created designated Provider Care Teams.  These Care Teams include your primary Cardiologist (physician) and Advanced Practice Providers (APPs -  Physician Assistants and Nurse Practitioners) who all work together to provide you with the care you need, when you need it.  We recommend signing up for the patient portal called MyChart.  Sign up information is provided on this After Visit Summary.  MyChart is used to connect with patients for Virtual Visits (Telemedicine).  Patients are able to view lab/test results, encounter notes, upcoming appointments, etc.  Non-urgent messages can be sent to your provider as  well.   To learn more about what you can do with MyChart, go to [email or web address].    Your next appointment:   1 month(s)  The format for your next appointment:   In Person  Provider:   Lamar Fitch, MD    Other Instructions NA

## 2023-09-02 ENCOUNTER — Telehealth: Payer: Self-pay | Admitting: Cardiology

## 2023-09-02 NOTE — Telephone Encounter (Signed)
 [employer] reports that the pt has had episodes of complete heart block from 1147 to 1350 today. Average rate is 34-44 bpm lasting 6 seconds and the longest being 48 seconds. Sheryll states that she spoke with the pt who was not having any symptoms. Attempted to call the pt without success. Left voice message to return call. Strips have been printed.

## 2023-09-02 NOTE — Telephone Encounter (Addendum)
 Outpatient service line: Complete heart block  We have received multiple transmissions from [employer] reporting complete heart block.  They are reporting another episode but seems to be with increasing in duration.  They reported complete heart block today during daytime hours around 1800.  Heart rates 36-50 with 30 seconds of complete heart block.  Called patient and he is asymptomatic, reporting no falls, dizziness, syncope.  Still reluctant to go to the emergency room, I recommended this given increased frequency and duration.  Will forward to primary cardiologist to follow-up.  Please address this soon as possible as we will continue to call him at all hours of the day due to necessity of reporting these findings unless otherwise directed.  Of note he does have multiple family members including son and grandchildren that are watching him.  ER precautions given and encouraged.  Beta-blocker was stopped yesterday morning.  Still likely needs more time for complete washout.

## 2023-09-02 NOTE — Telephone Encounter (Signed)
   Got alerted by [employer] of another episode of complete heart block, with HR as low as 36 bpm, lasting 6.1 seconds, occurring at 5:25 AM. Patient still reports no symptoms, no changes from last time we spoke (see prior note). He believes he was resting at this time. While I discussed the importance of being evaluated with ongoing episodes of complete heart block, he wants to remain with his plan of waiting for his beta-blocker to wash out or waiting until he becomes symptomatic. He does not want to go to the hospital at this time.  Waddell DELENA Donath, PA-C 09/02/2023 7:24 AM

## 2023-09-02 NOTE — Telephone Encounter (Signed)
 Sheryll with [employer] calling to report critical monitor results from this afternoon

## 2023-09-02 NOTE — Telephone Encounter (Signed)
   Received sign out from overnight fellow where he reported multiple episodes of complete heart block, see chart for his notes for full details. I call the patient who reported feeling fine over the last 24 to 48 hours.  He tells me that his last dose of carvedilol  was on 8/25.  He believes that without symptoms he is just needing to wait for his beta-blocker to washout.  Most of these events occurred while he was asleep, he had no symptoms.  He denied any chest pain, shortness of breath, dizziness, presyncope, syncope, fatigue, weakness.  I educated him that the overnight fellow was wanting him to get seen in the hospital/admitted due to repeated episodes of complete heart block. He told me that at this point he is wanting to wait until he has any symptoms, otherwise wanting to wait until the beta-blocker washes out or until he has some sort of complete heart block while he is awake. I instructed him on all of the symptoms that he should be aware of, and that he should have a low threshold to go to the emergency department as soon as he feels anything outside of his normal.  I will forward this message to Dr. Bernie, to ensure he is aware.  Waddell DELENA Donath, PA-C 09/02/2023 6:20 AM

## 2023-09-02 NOTE — Telephone Encounter (Signed)
 [employer] calling to notify 10.5s of CHB (HR 33-39bpm)  Pt was contacted- reported no sy mptoms Pt was also contacted earlier and had no symptoms and was told to stop coreg . ER precautions

## 2023-09-02 NOTE — Telephone Encounter (Signed)
 Received call from [facility] with reported 90 seconds of complete heart block. Rhythm strips reviewed and shows sinus rhythm with 2 to 1 AV block initially, PVC then followed by two beats of narrow junctional escape and then return to 2 to 1 AV block.   I attempted to call the patient at 2145 x2 with no answer. Will leave message for morning team to re-attempt contact to evaluate for symptoms and the need for PM.

## 2023-09-02 NOTE — Telephone Encounter (Signed)
 Called multiple times but no answer from the patient cell phone or home phone  [facility] again calling for CHB at 3am, 90seconds. Will call him in the morning and get him admitted given multiple episodes of CHB-

## 2023-09-03 ENCOUNTER — Telehealth: Payer: Self-pay | Admitting: Cardiology

## 2023-09-03 ENCOUNTER — Telehealth: Payer: Self-pay | Admitting: Student

## 2023-09-03 NOTE — Telephone Encounter (Signed)
 Spoke with daughter Nathanel. She stated that the notifications in the middle of the night and all day are causing the patient anxiety. Per Dr. Bernie called [employer] and all notifications have been cut off except critical notifications. Daughter made aware.

## 2023-09-03 NOTE — Telephone Encounter (Signed)
 Daughter Bobbetta) stated patient has been receiving a lot of messages related to his heart monitor and it is causing him to have anxiety/panic attached.  Daughter wants a call back to discuss next steps.

## 2023-09-03 NOTE — Telephone Encounter (Signed)
 Blake Johnston from [facility] is calling with abnormal results.

## 2023-09-03 NOTE — Telephone Encounter (Signed)
 [facility] calls received and reported to Dr. Krasowski. Pt has appt with EP on 09-04-23

## 2023-09-03 NOTE — Telephone Encounter (Signed)
 Attempted to call the patients cell phone and their home phone number and was unable to reach the patient. Will forward to Dr Krasowski.  Signed,  Morse Clause, PA-C 09/03/2023, 8:31 AM

## 2023-09-04 ENCOUNTER — Encounter: Payer: Self-pay | Admitting: Cardiovascular Disease

## 2023-09-04 ENCOUNTER — Ambulatory Visit: Attending: Cardiovascular Disease | Admitting: Cardiovascular Disease

## 2023-09-04 VITALS — BP 140/92 | HR 52 | Ht 75.0 in | Wt 222.0 lb

## 2023-09-04 DIAGNOSIS — I441 Atrioventricular block, second degree: Secondary | ICD-10-CM | POA: Diagnosis present

## 2023-09-04 DIAGNOSIS — Z01812 Encounter for preprocedural laboratory examination: Secondary | ICD-10-CM | POA: Diagnosis present

## 2023-09-04 NOTE — Patient Instructions (Signed)
 Medication Instructions:  Your physician recommends that you continue on your current medications as directed. Please refer to the Current Medication list given to you today.  *If you need a refill on your cardiac medications before your next appointment, please call your pharmacy*  Lab Work: None ordered.  If you have labs (blood work) drawn today and your tests are completely normal, you will receive your results only by: MyChart Message (if you have MyChart) OR A paper copy in the mail If you have any lab test that is abnormal or we need to change your treatment, we will call you to review the results.  Testing/Procedures: None ordered.   Follow-Up: At [facility], you and your health needs are our priority.  As part of our continuing mission to provide you with exceptional heart care, our providers are all part of one team.  This team includes your primary Cardiologist (physician) and Advanced Practice Providers or APPs (Physician Assistants and Nurse Practitioners) who all work together to provide you with the care you need, when you need it.  Your next appointment:   To be scheduled

## 2023-09-04 NOTE — Progress Notes (Signed)
 Electrophysiology Office Note:    Date:  09/04/2023   ID:  Blake Johnston , DOB 04/20/1945, MRN [id number]  PCP:  Charlott Dorn LABOR, MD   [facility] Providers Cardiologist:  None     Referring MD: Bernie Lamar PARAS, MD   History of Present Illness:    Blake Johnston  is a 78 y.o. male with a medical history significant for diabetes, hypertension, dyslipidemia, referred for evaluation and management of heart block.      Discussed the use of AI scribe software for clinical note transcription with the patient, who gave verbal consent to proceed.  History of Present Illness Blake Johnston  is a 78 year old male who presents for evaluation of heart rhythm issues and consideration of a pacemaker. He was referred by Dr. Krasowski for a cardiac evaluation.  He reports that he has a long history of slow heart rates, and has had a prior diagnosis of second-degree heart block when he was referred to cardiology for further evaluation.  At that visit, EKG showed 2-1 AV block he reports he never felt lightheaded or dizzy, and he never passed out.  He does have some decreased energy and is easily fatigued.  He was on carvedilol , which was discontinued and a 2-week monitor was placed.   After discontinuing Carvedilol , he experiences improved energy levels, reduced anxiety, and better sleep. Blood pressure is stable at approximately 123/66 mmHg. No lightheadedness, dizziness, chest pain, or shortness of breath. He uses a home blood pressure machine for monitoring. An echocardiogram is scheduled, as the last one was performed years ago.  His primary complaint is that he has been called repeatedly for low heart rates which have not been symptomatic for him at all.  He has been woken up at nighttime and urged to go to the ER.         Today, he reports that he is at baseline and has no acute complaints  EKGs/Labs/Other Studies Reviewed Today:      Echocardiogram:  TTE -ordered Results pending   Monitors:   monitor in place; alert strips available  -- my interpretation The monitor is currently in process.  I reviewed the alert strips.  There are several episodes labeled complete heart block.  There is, however, RR variability indicating that the atrium is driving some of these beats, which would make this second-degree AV block.  At times there are consecutive dropped beats, which would be classified as a high-grade AV block   EKG:   EKG Interpretation Date/Time:  Thursday September 04 2023 14:38:50 EDT Ventricular Rate:  52 PR Interval:    QRS Duration:  78 QT Interval:  444 QTC Calculation: 412 R Axis:   -56  Text Interpretation: Sinus rhythm with second degree AV block Left axis deviation Low voltage QRS Inferior infarct , age undetermined When compared with ECG of 01-Sep-2023 15:48, AV conduction has improved slightly Confirmed by Nancey Scotts [phone]) on 09/04/2023 3:05:03 PM     Physical Exam:    VS:  BP (!) 140/92 (BP Location: Right Arm, Patient Position: Sitting, Cuff Size: Large)   Pulse (!) 52   Ht 6' 3 (1.905 m)   Wt 222 lb (100.7 kg)   SpO2 98%   BMI 27.75 kg/m     Wt Readings from Last 3 Encounters:  09/04/23 222 lb (100.7 kg)  09/01/23 226 lb 12 oz (102.9 kg)  03/13/18 232 lb (105.2 kg)     GEN: Well nourished, well developed in  no acute distress CARDIAC: brady, irregular rhythm, no murmurs, rubs, gallops RESPIRATORY:  Normal work of breathing MUSCULOSKELETAL: no edema    ASSESSMENT & PLAN:     High-grade AV block I suspect his level of block is in the AV node as he has shown some winky block periodicity, conduction improved after discontinuation of carvedilol , conduction correlates with adrenergic tone, and QRS is narrow Regardless, I think he is still somewhat symptomatic from his slow heart rates with fatigue I think he is at low risk for sudden death, but there is some risk of  progression resulting in a fall, or need for urgent pacemaker placement I discussed the indication for pacemaker placement, the risks and benefits.  Specific risks mentioned include but are not limited to infection, bleeding perforation of an organ, need for surgery or drainage tube, and death.  At this time, he would like to consider his options, think and pray on it.  He would like to go ahead and schedule the procedure and reserve a spot however.  Hypertension BP elevated today May be somewhat compensatory for slow heart rates I am not can make any changes at this moment     Signed, Eulas FORBES Furbish, MD  09/04/2023 8:42 PM    [facility]

## 2023-09-09 ENCOUNTER — Telehealth (HOSPITAL_COMMUNITY): Payer: Self-pay

## 2023-09-09 NOTE — Telephone Encounter (Signed)
 Attempted to reach patient to discuss upcoming procedure, no answer. Left VM for patient to return call.

## 2023-09-10 NOTE — Telephone Encounter (Addendum)
 Patient returned call to discuss upcoming procedure.     Health status review:  Any new medical conditions, recent signs of acute illness or been started on antibiotics? No Any recent hospitalizations or surgeries? No Any new medications started since pre-op visit? No  When attempting to review medication instructions, patient stated he was getting ready for a funeral and would have to call back.

## 2023-09-11 ENCOUNTER — Telehealth: Payer: Self-pay

## 2023-09-11 NOTE — Telephone Encounter (Signed)
 Attempted phone call to pt to confirm pt is coming for pre-procedure lab work this week and to advise he will need to pick up surgical scrub at the [facility] desk.  Voicemail message left to contact RN at [phone].

## 2023-09-12 NOTE — Telephone Encounter (Signed)
 Attempted to reach patient to discuss upcoming procedure, no answer. Left VM for patient to return call.

## 2023-09-18 ENCOUNTER — Telehealth: Payer: Self-pay

## 2023-09-18 NOTE — Telephone Encounter (Signed)
 Work up complete...  Pt to pick up scrub from front desk... Orders entered.SABRASABRA

## 2023-09-18 NOTE — Telephone Encounter (Signed)
-----   Message from Nurse Aldona R sent at 09/11/2023 12:18 PM EDT ----- Regarding: Initial PPM - Mealor 10/07/23. Important: list procedure date as first item in subject line, followed by procedure type (e.g., 09/19/23 PPM implant)  Precert:  MD: Mealor Type of implant: PPM Device manufacturer: Provider unsure Diagnosis: [place] CPT code: 66791 C-code(s), including quantity (if indicated): C1785 and C 1898  Procedure scheduled (date/time): 10/07/23 3pm  Procedure:Initial PPM  Scrub given? No  Medication instructions: See Letter Message sent to [facility]? No Added to calendar? Yes Orders entered? No, >30 days before procedure Letter complete? Yes Scheduled with cath lab? Yes Labs ordered (CBC, BMET, PT/INR if on warfarin)? Yes Dye allergy? No Pre-meds ordered and instructions given? N/A Letter method: Mailed Special instructions:  H&P: 8/28  Follow-up:  Cassie/Angel, please schedule Routine.  Covering RN:  Please send this message to [employer], EP scheduler, EP Scheduling pool, and EP [employer].

## 2023-10-01 ENCOUNTER — Ambulatory Visit (HOSPITAL_BASED_OUTPATIENT_CLINIC_OR_DEPARTMENT_OTHER)
Admission: RE | Admit: 2023-10-01 | Discharge: 2023-10-01 | Disposition: A | Discharge status: Home / Self Care | Source: Ambulatory Visit | Attending: Cardiology | Admitting: Cardiology

## 2023-10-01 DIAGNOSIS — R0609 Other forms of dyspnea: Secondary | ICD-10-CM | POA: Insufficient documentation

## 2023-10-01 LAB — ECHOCARDIOGRAM COMPLETE
AR max vel: 4.42 cm2
AV Area VTI: 4.64 cm2
AV Area mean vel: 4.1 cm2
AV Mean grad: 4.5 mmHg
AV Peak grad: 7.4 mmHg
Ao pk vel: 1.36 m/s
Area-P 1/2: 3.97 cm2
Calc EF: 76.6 %
MV M vel: 3.42 m/s
MV Peak grad: 46.8 mmHg
S' Lateral: 2.8 cm
Single Plane A2C EF: 77.5 %
Single Plane A4C EF: 76.2 %

## 2023-10-01 NOTE — Telephone Encounter (Signed)
 Attempted to reach patient to discuss upcoming procedure, no answer. Left VM for patient to return call.

## 2023-10-06 DIAGNOSIS — I441 Atrioventricular block, second degree: Secondary | ICD-10-CM

## 2023-10-06 NOTE — Pre-Procedure Instructions (Signed)
Attempted to call patient regarding procedure instructions.  Left voice mail on the following items: Arrival time 1230 Nothing to eat or drink after midnight No meds AM of procedure Responsible person to drive you home and stay with you for 24 hrs Wash with special soap night before and morning of procedure  

## 2023-10-07 ENCOUNTER — Encounter (HOSPITAL_COMMUNITY): Admission: RE | Payer: Self-pay | Source: Home / Self Care

## 2023-10-07 ENCOUNTER — Telehealth: Payer: Self-pay

## 2023-10-07 ENCOUNTER — Ambulatory Visit (HOSPITAL_COMMUNITY): Admission: RE | Admit: 2023-10-07 | Source: Home / Self Care | Admitting: Cardiovascular Disease

## 2023-10-07 SURGERY — PACEMAKER IMPLANT

## 2023-10-07 NOTE — Telephone Encounter (Signed)
 Left voicemail for pt and daughter to call regarding no show to procedure and Echo results

## 2023-10-08 ENCOUNTER — Ambulatory Visit: Payer: Self-pay | Admitting: Cardiology

## 2023-10-08 NOTE — Telephone Encounter (Signed)
 Daughter Bobbetta) returned RN's call.

## 2023-10-08 NOTE — Telephone Encounter (Signed)
 Spoke with Blake Johnston per DPR who states that her dad wanted to get the echo results before the procedure. Blake Johnston will discuss with her dad and advised to schedule an appointment once he has processed all the information to discuss. Blake Johnston verbalized understanding and had no additional questions.  Results reviewed with Blake Johnston per DPR as per Dr. Karry note. Blake Johnston verbalized understanding and had no additional questions. Routed to PCP.

## 2023-10-23 ENCOUNTER — Ambulatory Visit: Attending: Cardiology | Admitting: Cardiology

## 2023-10-23 ENCOUNTER — Encounter: Payer: Self-pay | Admitting: *Deleted

## 2023-10-23 DIAGNOSIS — I517 Cardiomegaly: Secondary | ICD-10-CM | POA: Insufficient documentation

## 2023-11-14 ENCOUNTER — Emergency Department (HOSPITAL_BASED_OUTPATIENT_CLINIC_OR_DEPARTMENT_OTHER)
Admission: EM | Admit: 2023-11-14 | Discharge: 2023-11-15 | Disposition: A | Discharge status: Home / Self Care | Attending: Emergency Medicine | Admitting: Emergency Medicine

## 2023-11-14 ENCOUNTER — Encounter (HOSPITAL_BASED_OUTPATIENT_CLINIC_OR_DEPARTMENT_OTHER): Payer: Self-pay

## 2023-11-14 ENCOUNTER — Other Ambulatory Visit: Payer: Self-pay

## 2023-11-14 DIAGNOSIS — I10 Essential (primary) hypertension: Secondary | ICD-10-CM | POA: Insufficient documentation

## 2023-11-14 DIAGNOSIS — Z79899 Other long term (current) drug therapy: Secondary | ICD-10-CM | POA: Insufficient documentation

## 2023-11-14 DIAGNOSIS — I160 Hypertensive urgency: Secondary | ICD-10-CM

## 2023-11-14 DIAGNOSIS — Z794 Long term (current) use of insulin: Secondary | ICD-10-CM | POA: Insufficient documentation

## 2023-11-14 DIAGNOSIS — Z7982 Long term (current) use of aspirin: Secondary | ICD-10-CM | POA: Insufficient documentation

## 2023-11-14 DIAGNOSIS — R030 Elevated blood-pressure reading, without diagnosis of hypertension: Secondary | ICD-10-CM | POA: Diagnosis present

## 2023-11-14 LAB — CBC
HCT: 38.5 % — ABNORMAL LOW (ref 39.0–52.0)
Hemoglobin: 12.8 g/dL — ABNORMAL LOW (ref 13.0–17.0)
MCH: 25.2 pg — ABNORMAL LOW (ref 26.0–34.0)
MCHC: 33.2 g/dL (ref 30.0–36.0)
MCV: 75.8 fL — ABNORMAL LOW (ref 80.0–100.0)
Platelets: 326 K/uL (ref 150–400)
RBC: 5.08 MIL/uL (ref 4.22–5.81)
RDW: 16.6 % — ABNORMAL HIGH (ref 11.5–15.5)
WBC: 11.1 K/uL — ABNORMAL HIGH (ref 4.0–10.5)
nRBC: 0 % (ref 0.0–0.2)

## 2023-11-14 NOTE — ED Provider Notes (Signed)
 [place] [facility] Provider Note   CSN: [id number] Arrival date & time: 11/14/23  2303     Patient presents with: Hypertension   Blake Johnston  is a 78 y.o. male.   Patient is a 78 year old male accompanied by several family members for evaluation of elevated blood pressure and feeling generally unwell.  This has been ongoing intermittently for several months, but became worse this evening.  His blood pressure at home was in the 180s systolic.  Daughter gave a dose of rosuvastatin as this was suggested by the patient's doctor in case his blood pressure was running high.  This did not seem to help, so presents for evaluation.  He denies to me that he is having any chest pain or difficulty breathing.  On multiple occasions, he has reported to me that he feels very anxious and stressed out.  He reports the recent death of several family members as stressors.  He also reports that he has had adjustments in his blood pressure medications.  He has been taking amlodipine, but this medication was stopped, then recently restarted at a lower dose.       Prior to Admission medications   Medication Sig Start Date End Date Taking? Authorizing Provider  amLODipine (NORVASC) 10 MG tablet  12/23/16   [provider]  Ascorbic Acid (VITAMIN C) 500 MG CAPS See admin instructions.    [provider]  aspirin EC 81 MG tablet Take 81 mg by mouth daily.    [provider]  chlorthalidone (HYGROTON) 25 MG tablet Take 25 mg by mouth every morning. 09/30/19   [provider]  Cholecalciferol (VITAMIN D) 50 MCG (2000 UT) tablet 1 tablet    [provider]  COVID-19 mRNA bivalent vaccine, Pfizer, injection Inject into the muscle. 10/02/20   Luiz Channel, MD  COVID-19 mRNA Vac-TriS, Pfizer, (PFIZER-BIONT COVID-19 VAC-TRIS) SUSP injection Inject into the muscle. 04/11/20   Luiz Channel, MD  hydrochlorothiazide  (MICROZIDE )  12.5 MG capsule Take 1 capsule (12.5 mg total) by mouth daily. Please schedule annual appt with Dr. Pietro for refills. [phone]. 1st attempt. 04/26/19   Pietro Redell RAMAN, MD  ID NOW COVID-19 KIT TEST AS DIRECTED TODAY 05/21/20   [provider]  Insulin Pen Needle (PEN NEEDLES) 32G X 4 MM MISC use with insulin pen to inject insulin once daily 10/29/17   [provider]  irbesartan (AVAPRO) 300 MG tablet Take 300 mg by mouth daily. 08/11/19   [provider]  ketoconazole  (NIZORAL ) 2 % cream Apply 1 Application topically daily. 10/25/22   Standiford, Marsa FALCON, DPM  LORazepam (ATIVAN) 1 MG tablet Take 1 mg by mouth 2 (two) times daily. 02/14/20   [provider]  metFORMIN (GLUCOPHAGE) 1000 MG tablet Take 1,000 mg by mouth 2 (two) times daily with a meal.  02/17/17   [provider]  Multiple Vitamin (MULTIVITAMINS PO) 1 tablet    [provider]  Multiple Vitamin (MULTIVITAMINS PO) Take 1 tablet by mouth daily in the afternoon.    [provider]  rosuvastatin (CRESTOR) 5 MG tablet 1 tablet 09/02/19   [provider]  sertraline (ZOLOFT) 50 MG tablet Take by mouth. 02/14/20   [provider]  terbinafine  (LAMISIL ) 250 MG tablet Take 1 tablet (250 mg total) by mouth daily. 05/07/23   Scherrie Lamarr SQUIBB, DPM  TRESIBA FLEXTOUCH 100 UNIT/ML SOPN FlexTouch Pen  01/03/17   [provider]  TRULICITY 1.5 MG/0.5ML SOPN  once a week.  01/24/17   [provider]  valsartan (DIOVAN) 80 MG tablet Take 80 mg by mouth daily. 03/25/11   [provider]  vitamin B-12 (CYANOCOBALAMIN) 100 MCG tablet Take 100 mcg by mouth daily.    [provider]  zinc gluconate 50 MG tablet Take 1 tablet by mouth daily.    [provider]    Allergies: Ace inhibitors    Review of Systems  All other systems reviewed and are negative.   Updated Vital Signs BP (!) 193/84   Pulse 77   Temp 97.8 F (36.6  C) (Oral)   Resp 18   Ht 6' 3 (1.905 m)   Wt 101.2 kg   SpO2 98%   BMI 27.87 kg/m   Physical Exam Vitals and nursing note reviewed.  Constitutional:      General: He is not in acute distress.    Appearance: He is well-developed. He is not diaphoretic.  HENT:     Head: Normocephalic and atraumatic.  Eyes:     Extraocular Movements: Extraocular movements intact.     Pupils: Pupils are equal, round, and reactive to light.  Cardiovascular:     Rate and Rhythm: Normal rate and regular rhythm.     Heart sounds: No murmur heard.    No friction rub.  Pulmonary:     Effort: Pulmonary effort is normal. No respiratory distress.     Breath sounds: Normal breath sounds. No wheezing or rales.  Abdominal:     General: Bowel sounds are normal. There is no distension.     Palpations: Abdomen is soft.     Tenderness: There is no abdominal tenderness.  Musculoskeletal:        General: Normal range of motion.     Cervical back: Normal range of motion and neck supple.  Skin:    General: Skin is warm and dry.  Neurological:     General: No focal deficit present.     Mental Status: He is alert and oriented to person, place, and time.     Cranial Nerves: No cranial nerve deficit.     Motor: No weakness.     Coordination: Coordination normal.     Gait: Gait normal.     (all labs ordered are listed, but only abnormal results are displayed) Labs Reviewed  BASIC METABOLIC PANEL WITH GFR  CBC    EKG: None  Radiology: No results found.   Procedures   Medications Ordered in the ED - No data to display                                  Medical Decision Making Amount and/or Complexity of Data Reviewed Labs: ordered.  Risk Prescription drug management.   Patient is a 78 year old male presenting with complaints of elevated blood pressure and feeling generally unwell.  This has been an ongoing problem, but worse this evening.  He did not take his amlodipine prior to coming here  and blood pressures have been in the 170-180 range.  He arrives here with blood pressure that is similar to the readings at home, but vital signs otherwise stable.  He is neurologically intact and physical examination unremarkable.  Laboratory studies obtained including CBC and basic metabolic panel, both of which are basically unremarkable.  His blood sugar is 185.  Patient given p.o. Ativan and seems to be more calm and comfortable.  I highly  suspect an anxiety component to this presentation.  He appears very stressed and tells me several family members have passed away in the past few months and also describes other issues with his family that could be leading to this.  I see no evidence for stroke, cardiac event, or evidence for endorgan damage and feels the patient can safely be discharged.  I will prescribe a small amount of Ativan for him to take over the weekend and see if this helps.  He is also to keep a record of his blood pressures and follow-up with his primary doctor next week.  Given his level of stress and anxiety, I feel as though discussion with his primary doctor regarding medications to control this would be appropriate.     Final diagnoses:  None    ED Discharge Orders     None          Geroldine Berg, MD 11/15/23 0157

## 2023-11-14 NOTE — ED Triage Notes (Addendum)
 Pt reports HTN and lethargy x1 hour. Has not taken his night meds yet. Took rosuvastatin about an hour ago. Recent BP med changes.

## 2023-11-14 NOTE — ED Provider Notes (Incomplete)
 [place] [facility] Provider Note   CSN: [id number] Arrival date & time: 11/14/23  2303     Patient presents with: Hypertension   Blake Johnston  is a 78 y.o. male.  {Add pertinent medical, surgical, social history, OB history to HPI:32947} HPI     Prior to Admission medications   Medication Sig Start Date End Date Taking? Authorizing Provider  amLODipine (NORVASC) 10 MG tablet  12/23/16   [provider]  Ascorbic Acid (VITAMIN C) 500 MG CAPS See admin instructions.    [provider]  aspirin EC 81 MG tablet Take 81 mg by mouth daily.    [provider]  chlorthalidone (HYGROTON) 25 MG tablet Take 25 mg by mouth every morning. 09/30/19   [provider]  Cholecalciferol (VITAMIN D) 50 MCG (2000 UT) tablet 1 tablet    [provider]  COVID-19 mRNA bivalent vaccine, Pfizer, injection Inject into the muscle. 10/02/20   Luiz Channel, MD  COVID-19 mRNA Vac-TriS, Pfizer, (PFIZER-BIONT COVID-19 VAC-TRIS) SUSP injection Inject into the muscle. 04/11/20   Luiz Channel, MD  hydrochlorothiazide  (MICROZIDE ) 12.5 MG capsule Take 1 capsule (12.5 mg total) by mouth daily. Please schedule annual appt with Dr. Pietro for refills. [phone]. 1st attempt. 04/26/19   Pietro Redell RAMAN, MD  ID NOW COVID-19 KIT TEST AS DIRECTED TODAY 05/21/20   [provider]  Insulin Pen Needle (PEN NEEDLES) 32G X 4 MM MISC use with insulin pen to inject insulin once daily 10/29/17   [provider]  irbesartan (AVAPRO) 300 MG tablet Take 300 mg by mouth daily. 08/11/19   [provider]  ketoconazole  (NIZORAL ) 2 % cream Apply 1 Application topically daily. 10/25/22   Standiford, Marsa FALCON, DPM  LORazepam (ATIVAN) 1 MG tablet Take 1 mg by mouth 2 (two) times daily. 02/14/20   [provider]  metFORMIN (GLUCOPHAGE) 1000 MG tablet Take 1,000 mg by mouth 2 (two) times daily with a meal.   02/17/17   [provider]  Multiple Vitamin (MULTIVITAMINS PO) 1 tablet    [provider]  Multiple Vitamin (MULTIVITAMINS PO) Take 1 tablet by mouth daily in the afternoon.    [provider]  rosuvastatin (CRESTOR) 5 MG tablet 1 tablet 09/02/19   [provider]  sertraline (ZOLOFT) 50 MG tablet Take by mouth. 02/14/20   [provider]  terbinafine  (LAMISIL ) 250 MG tablet Take 1 tablet (250 mg total) by mouth daily. 05/07/23   Scherrie Lamarr SQUIBB, DPM  TRESIBA FLEXTOUCH 100 UNIT/ML SOPN FlexTouch Pen  01/03/17   [provider]  TRULICITY 1.5 MG/0.5ML SOPN once a week.  01/24/17   [provider]  valsartan (DIOVAN) 80 MG tablet Take 80 mg by mouth daily. 03/25/11   [provider]  vitamin B-12 (CYANOCOBALAMIN) 100 MCG tablet Take 100 mcg by mouth daily.    [provider]  zinc gluconate 50 MG tablet Take 1 tablet by mouth daily.    [provider]    Allergies: Ace inhibitors    Review of Systems  Updated Vital Signs BP (!) 193/84   Pulse 77   Temp 97.8 F (36.6 C) (Oral)   Resp 18   Ht 6' 3 (1.905 m)   Wt 101.2 kg   SpO2 98%   BMI 27.87 kg/m   Physical Exam  (all labs ordered are listed, but only abnormal results are displayed) Labs Reviewed  BASIC METABOLIC PANEL WITH GFR  CBC  EKG: None  Radiology: No results found.  {Document cardiac monitor, telemetry assessment procedure when appropriate:32947} Procedures   Medications Ordered in the ED - No data to display    {Click here for ABCD2, HEART and other calculators REFRESH Note before signing:1}                              Medical Decision Making Amount and/or Complexity of Data Reviewed Labs: ordered.   ***  {Document critical care time when appropriate  Document review of labs and clinical decision tools ie CHADS2VASC2, etc  Document your independent review of radiology images and any outside records  Document  your discussion with family members, caretakers and with consultants  Document social determinants of health affecting pt's care  Document your decision making why or why not admission, treatments were needed:32947:::1}   Final diagnoses:  None    ED Discharge Orders     None

## 2023-11-15 DIAGNOSIS — I10 Essential (primary) hypertension: Secondary | ICD-10-CM | POA: Diagnosis not present

## 2023-11-15 LAB — BASIC METABOLIC PANEL WITH GFR
Anion gap: 13 (ref 5–15)
BUN: 14 mg/dL (ref 8–23)
CO2: 26 mmol/L (ref 22–32)
Calcium: 9 mg/dL (ref 8.9–10.3)
Chloride: 97 mmol/L — ABNORMAL LOW (ref 98–111)
Creatinine, Ser: 0.78 mg/dL (ref 0.61–1.24)
GFR, Estimated: >60 mL/min (ref >60)
Glucose, Bld: 185 mg/dL — ABNORMAL HIGH (ref 70–99)
Potassium: 3.6 mmol/L (ref 3.5–5.1)
Sodium: 136 mmol/L (ref 135–145)

## 2023-11-15 MED ORDER — LORAZEPAM 1 MG PO TABS
1.0000 mg | ORAL_TABLET | Freq: Once | ORAL | Status: AC
  Administered 2023-11-15: 1 mg via ORAL
  Filled 2023-11-15: qty 1

## 2023-11-15 MED ORDER — LORAZEPAM 1 MG PO TABS: 1.0000 mg | ORAL_TABLET | Freq: Four times a day (QID) | ORAL | 0 refills | Status: AC | PRN

## 2023-11-15 NOTE — Discharge Instructions (Signed)
 Continue taking amlodipine as previously prescribed.  Begin taking Ativan as prescribed as needed for anxiety/stress.  Keep a record of your blood pressures at home and follow-up with your primary doctor next week for a recheck.  Return to the ER if you develop worsening symptoms, blood pressure repeatedly greater than 200, or for other new and concerning symptoms.

## 2023-11-17 ENCOUNTER — Telehealth: Payer: Self-pay

## 2023-11-17 NOTE — Telephone Encounter (Signed)
 Pt's Daughter called asking to reschedule his PPM Implant. He was scheduled with Dr. Nancey on 9/30 but cancelled due to being so anxious and nervous about the procedure.  His Daughter is now concerned because he is feeling more weak, tired, fatigued and having some low heart rates. Dr. Marko first available is 12/24 and she is worried about him waiting that long.  I scheduled him to see Daphne Barrack, NP on 11/20 to be re-evaluated since he was last seen in August by Dr. Nancey.   I sent Daphne a message to update her on the daughters concerns. If she doesn't think the pt needs to wait until 12/24, I will get with Dr. Nancey and see where we can get him in sooner for his PPM Implant.

## 2023-11-21 ENCOUNTER — Telehealth: Payer: Self-pay

## 2023-11-21 NOTE — Telephone Encounter (Signed)
 I spoke with pt's Daughter and moved his procedure from 12/24 to 12/9.[email or web address]

## 2023-11-21 NOTE — Telephone Encounter (Signed)
 LM to inform her that Dr. Nancey has a sooner date available to do his PPM Implant if they would like to move his procedure up to 12/9. I left my direct number for her to call and let me know if they are interested in that date.

## 2023-11-21 NOTE — Telephone Encounter (Signed)
 Daughter Bobbetta) returned staff call.

## 2023-11-23 NOTE — Progress Notes (Unsigned)
  Electrophysiology Office Note:   Date:  11/27/2023  ID:  Blake Johnston , DOB 23-Jul-1945, MRN [id number]  Primary Cardiologist: Lamar Fitch, MD Primary Heart Failure: None Electrophysiologist: Eulas FORBES Furbish, MD      History of Present Illness:   Blake Johnston  is a 78 y.o. male with h/o Mobitz 1 Second Degree AVB, HTN, HLD, DM seen today for routine electrophysiology followup.   He ws seen in EP Clinic 09/04/23 by Dr. Furbish and recommended for PPM in the setting of high degree AVB.  The patient elected to think about it at that time.   Since last being seen in our clinic the patient reports he has been fatigued.  His daughter and son accompany him to visit.  He denies shortness of breath, lightheadedness, syncope.     He denies chest pain, palpitations, dyspnea, PND, orthopnea, nausea, vomiting, dizziness, syncope, edema, weight gain, or early satiety.   Review of systems complete and found to be negative unless listed in HPI.   EP Information / Studies Reviewed:    EKG is ordered today. Personal review as below.  EKG Interpretation Date/Time:  Thursday November 27 2023 08:04:53 EST Ventricular Rate:  52 PR Interval:    QRS Duration:  88 QT Interval:  444 QTC Calculation: 412 R Axis:   -48  Text Interpretation: 2:1 AVB Left axis deviation Low voltage QRS Confirmed by Aniceto Jarvis ([id number]) on 11/27/2023 8:13:28 AM    Arrhythmia / AAD / Pertinent EP Studies 2nd Degree AVB / Mobitz I  Risk Assessment/Calculations:              Physical Exam:   VS:  BP 120/68 (BP Location: Left Arm, Patient Position: Sitting, Cuff Size: Normal)   Pulse (!) 52   Ht 6' 3 (1.905 m)   Wt 222 lb (100.7 kg)   BMI 27.75 kg/m    Wt Readings from Last 3 Encounters:  11/27/23 222 lb (100.7 kg)  09/04/23 222 lb (100.7 kg)  09/01/23 226 lb 12 oz (102.9 kg)     GEN: Well nourished, well developed in no acute distress NECK: No JVD; No carotid bruits CARDIAC: Regular  rate and rhythm, no murmurs, rubs, gallops RESPIRATORY:  Clear to auscultation without rales, wheezing or rhonchi  ABDOMEN: Soft, non-tender, non-distended EXTREMITIES:  No edema; No deformity   ASSESSMENT AND PLAN:    2nd Degree AVB, Mobitz I  High Degree AVB  -EKG with 2:1 AVB  -pt has decided to move forward with PPM    -symptomatic with fatigue -planned for PPM 12/16/23  -pre-procedure labs > CBC, BMP  -procedure reviewed in detail with patient and family, discussed risks / benefits and they wish to proceed -written instructions (daughter also recorded visit), soap given to patient  -discussed post procedure care and arm restrictions  -avoid AV nodal blockers in the short term  -EKG with 2:1   Hypertension  -well controlled on current regimen    Follow up with Dr. Furbish as planned on 12/16/23 for PPM    Signed, Jarvis Aniceto, NP-C, AGACNP-BC [facility] - Electrophysiology  11/27/2023, 10:33 AM

## 2023-11-23 NOTE — H&P (View-Only) (Signed)
  Electrophysiology Office Note:   Date:  11/27/2023  ID:  Blake Johnston , DOB December 12, 1945, MRN [id number]  Primary Cardiologist: Lamar Fitch, MD Primary Heart Failure: None Electrophysiologist: Eulas FORBES Furbish, MD      History of Present Illness:   Blake Johnston  is a 78 y.o. male with h/o Mobitz 1 Second Degree AVB, HTN, HLD, DM seen today for routine electrophysiology followup.   He ws seen in EP Clinic 09/04/23 by Dr. Furbish and recommended for PPM in the setting of high degree AVB.  The patient elected to think about it at that time.   Since last being seen in our clinic the patient reports he has been fatigued.  His daughter and son accompany him to visit.  He denies shortness of breath, lightheadedness, syncope.     He denies chest pain, palpitations, dyspnea, PND, orthopnea, nausea, vomiting, dizziness, syncope, edema, weight gain, or early satiety.   Review of systems complete and found to be negative unless listed in HPI.   EP Information / Studies Reviewed:    EKG is ordered today. Personal review as below.  EKG Interpretation Date/Time:  Thursday November 27 2023 08:04:53 EST Ventricular Rate:  52 PR Interval:    QRS Duration:  88 QT Interval:  444 QTC Calculation: 412 R Axis:   -48  Text Interpretation: 2:1 AVB Left axis deviation Low voltage QRS Confirmed by Aniceto Jarvis ([id number]) on 11/27/2023 8:13:28 AM    Arrhythmia / AAD / Pertinent EP Studies 2nd Degree AVB / Mobitz I  Risk Assessment/Calculations:              Physical Exam:   VS:  BP 120/68 (BP Location: Left Arm, Patient Position: Sitting, Cuff Size: Normal)   Pulse (!) 52   Ht 6' 3 (1.905 m)   Wt 222 lb (100.7 kg)   BMI 27.75 kg/m    Wt Readings from Last 3 Encounters:  11/27/23 222 lb (100.7 kg)  09/04/23 222 lb (100.7 kg)  09/01/23 226 lb 12 oz (102.9 kg)     GEN: Well nourished, well developed in no acute distress NECK: No JVD; No carotid bruits CARDIAC: Regular  rate and rhythm, no murmurs, rubs, gallops RESPIRATORY:  Clear to auscultation without rales, wheezing or rhonchi  ABDOMEN: Soft, non-tender, non-distended EXTREMITIES:  No edema; No deformity   ASSESSMENT AND PLAN:    2nd Degree AVB, Mobitz I  High Degree AVB  -EKG with 2:1 AVB  -pt has decided to move forward with PPM    -symptomatic with fatigue -planned for PPM 12/16/23  -pre-procedure labs > CBC, BMP  -procedure reviewed in detail with patient and family, discussed risks / benefits and they wish to proceed -written instructions (daughter also recorded visit), soap given to patient  -discussed post procedure care and arm restrictions  -avoid AV nodal blockers in the short term  -EKG with 2:1   Hypertension  -well controlled on current regimen    Follow up with Dr. Furbish as planned on 12/16/23 for PPM    Signed, Jarvis Aniceto, NP-C, AGACNP-BC [facility] - Electrophysiology  11/27/2023, 10:33 AM

## 2023-11-24 ENCOUNTER — Ambulatory Visit: Attending: Cardiology | Admitting: Cardiology

## 2023-11-24 NOTE — Progress Notes (Deleted)
 Cardiology Office Note   Date:  11/24/2023  ID:  Blake Johnston , DOB October 01, 1945, MRN [id number] PCP: Charlott Dorn LABOR, MD  [facility] Providers Cardiologist:  Lamar Fitch, MD Electrophysiologist:  Eulas FORBES Furbish, MD { Click to update primary MD,subspecialty MD or APP then REFRESH:1}    History of Present Illness Blake Johnston  is a 78 y.o. 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 Pietro in 2019 for the evaluation of an abnormal EKG at the behest of his PCP, he was also bradycardic but overall asymptomatic.  An echocardiogram had already been completed by his PCP revealing a normal EF with mild LVH and grade 1 DD, ascending aorta noted to be mildly dilated at 4.3 cm.  A stress echo was arranged which was negative for ischemia.  In August 2025 he was evaluated by Dr. Fitch, he had no formal complaints however it appeared his PCP had noticed a second-degree AV block on his EKG.  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 Furbish and was evaluated by him on 09/04/2023, he wanted to consider the next steps and take some time before agreeing to pacemaker implantation and ultimately agreed to proceed and this is currently scheduled for 12/16/2023.  ROS: ROS   Studies Reviewed      Cardiac Studies & Procedures   ______________________________________________________________________________________________   STRESS TESTS  ECHOCARDIOGRAM STRESS TEST  12/29/2017  Narrative *[facility]* [address] [place], [place] [address] [phone]  ------------------------------------------------------------------- Stress Echocardiography  Patient:    Blake Johnston, Blake Johnston MR #:       [id number] Study Date: 12/29/2017 Gender:     M Age:        58 Height:     190.5 cm Weight:     108.6 kg BSA:        2.42 m^2 Pt. Status: Room:  ORDERING     [facility] REFERRING    [address] [place], [place] [address] PERFORMING   [facility], Outpatient SONOGRAPHER  [facility], RDCS ATTENDING    Annabella Scarce, MD  cc:  ------------------------------------------------------------------- LV EF: 55% -   60%  ------------------------------------------------------------------- Indications:      Abnormal EKG (R94.31).  ------------------------------------------------------------------- History:   Risk factors:  Former tobacco use. Hypertension. Diabetes mellitus.  Medications:  No other medications.  ------------------------------------------------------------------- Study Conclusions  - Left ventricle: Systolic function was normal. The estimated ejection fraction was in the range of 55% to 60%. - Baseline ECG: Normal sinus rhythm with 1degrees AV block. - Stress ECG conclusions: There were no stress arrhythmias or conduction abnormalities. The stress ECG was negative for ischemia. - Staged echo: There was no echocardiographic evidence for stress-induced ischemia.  Impressions:  - Negative, adequate stress test. Normal systolic function and wall motion both at rest and with exercise. Excellent exercise capacity.  ------------------------------------------------------------------- Labs, prior tests, procedures, and surgery: ECG.     Abnormal.  ------------------------------------------------------------------- Study data:   Study status:  Routine.  Consent:  The risks, benefits, and alternatives  to the  procedure were explained to the patient and informed consent was obtained.  Procedure:  Initial setup. The patient was brought to the laboratory. A baseline ECG was recorded. Surface ECG leads and automatic cuff blood pressure measurements were monitored. Treadmill exercise testing was performed using the Bruce protocol. The patient exercised for 8 min, to protocol stage 3, to a maximal work rate of 10 mets. Exercise was terminated due to arrhythmia, patient request, severe dyspnea, and moderate fatigue. The patient was positioned for image acquisition and recovery monitoring. Transthoracic stress echocardiography for risk stratification. Images were captured at baseline and peak exercise.  Study completion:  The patient tolerated the procedure well. There were no complications. Bruce protocol. Stress echocardiography.  Birthdate:  Patient birthdate: 1945/02/21.  Age:  Patient is 78 yr old.  Sex:  Gender: male.    BMI: 29.9 kg/m^2.  Blood pressure:     146/78  Patient status:  Outpatient.  Study date:  Study date: 12/29/2017. Study time: 02:19 PM.  -------------------------------------------------------------------  ------------------------------------------------------------------- Left ventricle:  Systolic function was normal. The estimated ejection fraction was in the range of 55% to 60%.  ------------------------------------------------------------------- Baseline ECG:   Normal sinus rhythm with 1degrees AV block. Q waves in V1, V2, V3, V4, V5. Low voltage.  ------------------------------------------------------------------- Stress protocol:  +----------------+-----------+----------+---+------------+--------+ !Stage           !Time into  !Time into !HR !BP (mmHg)   !Symptoms! !                !test       !phase     !   !            !        ! +----------------+-----------+----------+---+------------+--------+ !Baseline        !-----------!----------!74 !146/78 (101)!None     ! +----------------+-----------+----------+---+------------+--------+ !Stage 1         !3 min      !3 min     !108!174/80 (111)!--------! +----------------+-----------+----------+---+------------+--------+ !Stage 2         !6 min      !3 min     !114!180/82 (115)!--------! +----------------+-----------+----------+---+------------+--------+ !Stage 3         !8 min      !2 min     !114!------------!--------! +----------------+-----------+----------+---+------------+--------+ !Immediate post  !-----------!----------!122!------------!--------! !stress          !           !          !   !            !        ! +----------------+-----------+----------+---+------------+--------+ !Recovery; 1 min !-----------!----------!121!------------!--------! +----------------+-----------+----------+---+------------+--------+ !Recovery; 2 min !-----------!----------!100!------------!--------! +----------------+-----------+----------+---+------------+--------+ !Recovery; 3 min !-----------!----------!105!196/66 (109)!--------! +----------------+-----------+----------+---+------------+--------+ !Recovery; 5 min !-----------!----------!90 !------------!--------! +----------------+-----------+----------+---+------------+--------+  ------------------------------------------------------------------- Stress results:   Maximal heart rate during stress was 122 bpm (82% of maximal predicted heart rate). The maximal predicted heart rate was 149 bpm.The target heart rate was achieved. The heart rate response to stress was normal. There was a normal resting blood pressure with an appropriate response to stress. The rate-pressure product for the peak heart rate and blood pressure was 79419 mm Hg/min.  The patient experienced no chest pain during stress.  ------------------------------------------------------------------- Stress ECG:  There were no stress arrhythmias or conduction abnormalities.  Isolated ventricular  ectopy.  The stress ECG was negative for ischemia.  ------------------------------------------------------------------- Baseline:  - LV global systolic function was normal. The estimated LV ejection fraction was 55-60%. - Normal  wall motion; no LV regional wall motion abnormalities.  Peak stress:  - LV global systolic function was appropriately augmented from baseline. The estimated LV ejection fraction was 65-70%. - Normal wall motion; no LV regional wall motion abnormalities. - No evidence for new LV regional wall motion abnormalities.  ------------------------------------------------------------------- Stress echo results:     Left ventricular ejection fraction was normal at rest and with stress. There was no echocardiographic evidence for stress-induced ischemia.  ------------------------------------------------------------------- Prepared and Electronically Authenticated by  Annabella Scarce, MD 2019-12-23T18:28:27   ECHOCARDIOGRAM  ECHOCARDIOGRAM COMPLETE 10/01/2023  Narrative ECHOCARDIOGRAM REPORT    Patient Name:   Blake Johnston  Date of Exam: 10/01/2023 Medical Rec #:  [id number]            Height:       75.0 in Accession #:    [id number]           Weight:       222.0 lb Date of Birth:  Sep 10, 1945           BSA:          2.294 m Patient Age:    77 years             BP:           140/92 mmHg Patient Gender: M                    HR:           56 bpm. Exam Location:  [place]  Procedure: 2D Echo, Cardiac Doppler, Color Doppler and Strain Analysis (Both Spectral and Color Flow Doppler were utilized during procedure).  Indications:    R06.9 DOE  History:        Patient has prior history of Echocardiogram examinations, most recent 12/29/2017. Arrythmias:2nd degree AV block, Signs/Symptoms:Dyspnea; Risk Factors:Hypertension, Diabetes, Dyslipidemia and Former Smoker.  Sonographer:    Alan Greenhouse RDMS, RVT, RDCS Referring Phys: [phone] LAMAR PARAS  KRASOWSKI  IMPRESSIONS   1. Left ventricular ejection fraction, by estimation, is 70 to 75%. Left ventricular ejection fraction by 2D MOD biplane is 76.6 %. The left ventricle has hyperdynamic function. The left ventricle has no regional wall motion abnormalities. There is severe concentric left ventricular hypertrophy. Left ventricular diastolic parameters were normal. The average left ventricular global longitudinal strain is -23.9 %. The global longitudinal strain is normal. 2. Right ventricular systolic function is normal. The right ventricular size is normal. 3. The mitral valve is normal in structure. No evidence of mitral valve regurgitation. No evidence of mitral stenosis. 4. The aortic valve is tricuspid. Aortic valve regurgitation is not visualized. No aortic stenosis is present. 5. 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 6. The inferior vena cava is normal in size with greater than 50% respiratory variability, suggesting right atrial pressure of 3 mmHg.  Comparison(s): Stress echocardiogram done 12/29/17 showed an EF of 55-60%.  FINDINGS Left Ventricle: Left ventricular ejection fraction, by estimation, is 70 to 75%. Left ventricular ejection fraction by 2D MOD biplane is 76.6 %. The left ventricle has hyperdynamic function. The left ventricle has no regional wall motion abnormalities. The average left ventricular global longitudinal strain is -23.9 %. Strain was performed and the global longitudinal strain is normal. The left ventricular internal cavity size was normal in size. There is severe concentric left ventricular hypertrophy. Left ventricular diastolic parameters were normal. Normal left ventricular filling pressure.  Right Ventricle: The right ventricular size  is normal. No increase in right ventricular wall thickness. Right ventricular systolic function is normal.  Left Atrium: Left atrial size was normal in size.  Right Atrium: Right  atrial size was normal in size.  Pericardium: Trivial pericardial effusion is present. The pericardial effusion is posterior to the left ventricle.  Mitral Valve: The mitral valve is normal in structure. No evidence of mitral valve regurgitation. No evidence of mitral valve stenosis.  Tricuspid Valve: The tricuspid valve is normal in structure. Tricuspid valve regurgitation is trivial. No evidence of tricuspid stenosis.  Aortic Valve: The aortic valve is tricuspid. Aortic valve regurgitation is not visualized. No aortic stenosis is present. Aortic valve mean gradient measures 4.5 mmHg. Aortic valve peak gradient measures 7.4 mmHg. Aortic valve area, by VTI measures 4.64 cm.  Pulmonic Valve: The pulmonic valve was normal in structure. Pulmonic valve regurgitation is not visualized. No evidence of pulmonic stenosis.  Aorta: The aortic root is normal in size and structure and the aortic arch was not well visualized. 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  Venous: The pulmonary veins were not well visualized. The inferior vena cava is normal in size with greater than 50% respiratory variability, suggesting right atrial pressure of 3 mmHg.  IAS/Shunts: No atrial level shunt detected by color flow Doppler.   LEFT VENTRICLE PLAX 2D                        Biplane EF (MOD) LVIDd:         5.20 cm         LV Biplane EF:   Left LVIDs:         2.80 cm                          ventricular LV PW:         1.80 cm                          ejection LV IVS:        1.80 cm                          fraction by LVOT diam:     2.40 cm                          2D MOD LV SV:         131                              biplane is LV SV Index:   57                               76.6 %. LVOT Area:     4.52 cm LV IVRT:       174 msec        Diastology LV e' medial:    6.68 cm/s LV E/e' medial:  9.5 LV Volumes (MOD)               LV e' lateral:   12.10 cm/s LV vol d, MOD     115.0 ml      LV E/e' lateral: 5.3 A2C: LV vol d,  MOD    90.4 ml       2D Longitudinal A4C:                           Strain LV vol s, MOD    25.9 ml       2D Strain GLS   -23.1 % A2C:                           (A4C): LV vol s, MOD    21.5 ml       2D Strain GLS   -23.6 % A4C:                           (A3C): LV SV MOD A2C:   89.1 ml       2D Strain GLS   -24.9 % LV SV MOD A4C:   90.4 ml       (A2C): LV SV MOD BP:    84.8 ml       2D Strain GLS   -23.9 % Avg:  RIGHT VENTRICLE RV S prime:     15.40 cm/s TAPSE (M-mode): 2.3 cm  LEFT ATRIUM             Index        RIGHT ATRIUM           Index LA diam:        3.80 cm 1.66 cm/m   RA Area:     21.90 cm LA Vol (A2C):   50.2 ml 21.88 ml/m  RA Volume:   60.10 ml  26.20 ml/m LA Vol (A4C):   92.2 ml 40.19 ml/m LA Biplane Vol: 73.4 ml 32.00 ml/m AORTIC VALVE AV Area (Vmax):    4.42 cm AV Area (Vmean):   4.10 cm AV Area (VTI):     4.64 cm AV Vmax:           136.00 cm/s AV Vmean:          97.000 cm/s AV VTI:            0.282 m AV Peak Grad:      7.4 mmHg AV Mean Grad:      4.5 mmHg LVOT Vmax:         133.00 cm/s LVOT Vmean:        87.900 cm/s LVOT VTI:          0.290 m LVOT/AV VTI ratio: 1.03  AORTA Ao Root diam: 3.95 cm Ao Asc diam:  4.00 cm  MITRAL VALVE               TRICUSPID VALVE MV Area (PHT): 3.97 cm    TR Peak grad:   11.6 mmHg MV Decel Time: 191 msec    TR Vmax:        170.00 cm/s MR Peak grad: 46.8 mmHg MR Vmax:      342.00 cm/s  SHUNTS MV E velocity: 63.77 cm/s  Systemic VTI:  0.29 m MV A velocity: 71.27 cm/s  Systemic Diam: 2.40 cm MV E/A ratio:  0.89  Redell Leiter MD Electronically signed by Redell Leiter MD Signature Date/Time: 10/01/2023/6:07:33 PM    Final    MONITORS  LONG TERM MONITOR-LIVE TELEMETRY (3-14 DAYS) 09/24/2023  Narrative Patch Wear Time:  2 days and 23 hours (2025-08-25T16:32:13-0400 to 2025-08-28T15:40:37-0400)  Patient had a min HR of 25 bpm,  max HR of 90 bpm, and avg HR of  45 bpm. Predominant underlying rhythm was Sinus Rhythm. 1449 episode(s) of AV Block (3rd) occurred, lasting a total of 7 hours 6 mins. Idioventricular Rhythm was present. Second Degree AV Block-Mobitz I (Wenckebach) was present. Isolated SVEs were rare (<1.0%), SVE Couplets were rare (<1.0%), and SVE Triplets were rare (<1.0%). Isolated VEs were rare (<1.0%, 1216), VE Couplets were rare (<1.0%, 14), and VE Triplets were rare (<1.0%, 1). Difficulty discerning atrial activity making definitive diagnosis difficult to ascertain. Previously Notified: MD notification criteria for Complete Heart Block met - report posted prior to notification (PG).  Summary and conclusions: Episodes of high degree AV block suspicion for complete heart block. Patient has been follow-up tolerated by EP and pacemaker has been scheduled.       ______________________________________________________________________________________________      Risk Assessment/Calculations {Does this patient have ATRIAL FIBRILLATION?:[phone]} No BP recorded.  {Refresh Note OR Click here to enter BP  :1}***       Physical Exam VS:  There were no vitals taken for this visit.       Wt Readings from Last 3 Encounters:  09/04/23 222 lb (100.7 kg)  09/01/23 226 lb 12 oz (102.9 kg)  03/13/18 232 lb (105.2 kg)    GEN: Well nourished, well developed in no acute distress NECK: No JVD; No carotid bruits CARDIAC: ***RRR, no murmurs, rubs, gallops RESPIRATORY:  Clear to auscultation without rales, wheezing or rhonchi  ABDOMEN: Soft, non-tender, non-distended EXTREMITIES:  No edema; No deformity   ASSESSMENT AND PLAN Sick sinus syndrome  Hypertension -     {Are you ordering a CV Procedure (e.g. stress test, cath, DCCV, TEE, etc)?   Press F2        :[id number]}  Dispo: ***  Signed, Delon JAYSON Hoover, NP

## 2023-11-27 ENCOUNTER — Ambulatory Visit: Attending: Pulmonary Disease | Admitting: Pulmonary Disease

## 2023-11-27 ENCOUNTER — Encounter: Payer: Self-pay | Admitting: Pulmonary Disease

## 2023-11-27 VITALS — BP 120/68 | HR 52 | Ht 75.0 in | Wt 222.0 lb

## 2023-11-27 DIAGNOSIS — I441 Atrioventricular block, second degree: Secondary | ICD-10-CM | POA: Diagnosis present

## 2023-11-27 DIAGNOSIS — I10 Essential (primary) hypertension: Secondary | ICD-10-CM | POA: Diagnosis present

## 2023-11-27 LAB — CBC

## 2023-11-27 NOTE — Patient Instructions (Signed)
 Medication Instructions:   No medication changes.   *If you need a refill on your cardiac medications before your next appointment, please call your pharmacy*  Lab Work: CBC, BMP for pre-procedure labs  If you have labs (blood work) drawn today and your tests are completely normal, you will receive your results only by: MyChart Message (if you have MyChart) OR A paper copy in the mail If you have any lab test that is abnormal or we need to change your treatment, we will call you to review the results.  Testing/Procedures: Planned for PPM insertion on 12/16/23   Follow-Up: At [facility], you and your health needs are our priority.  As part of our continuing mission to provide you with exceptional heart care, our providers are all part of one team.  This team includes your primary Cardiologist (physician) and Advanced Practice Providers or APPs (Physician Assistants and Nurse Practitioners) who all work together to provide you with the care you need, when you need it.  Your next appointment:   Will be arranged after your device is implanted. You will leave the hospital with appts.   Provider:   You may see Eulas FORBES Furbish, MD or one of the following Advanced Practice Providers on your designated Care Team:    Daphne Barrack, NP    We recommend signing up for the patient portal called MyChart.  Sign up information is provided on this After Visit Summary.  MyChart is used to connect with patients for Virtual Visits (Telemedicine).  Patients are able to view lab/test results, encounter notes, upcoming appointments, etc.  Non-urgent messages can be sent to your provider as well.   To learn more about what you can do with MyChart, go to [email or web address].

## 2023-11-28 ENCOUNTER — Ambulatory Visit: Payer: Self-pay | Admitting: Pulmonary Disease

## 2023-11-28 LAB — CBC
Hematocrit: 41.3 % (ref 37.5–51.0)
Hemoglobin: 12.9 g/dL — ABNORMAL LOW (ref 13.0–17.7)
MCH: 24.8 pg — ABNORMAL LOW (ref 26.6–33.0)
MCHC: 31.2 g/dL — ABNORMAL LOW (ref 31.5–35.7)
MCV: 79 fL (ref 79–97)
Platelets: 382 x10E3/uL (ref 150–450)
RBC: 5.2 x10E6/uL (ref 4.14–5.80)
RDW: 15.9 % — ABNORMAL HIGH (ref 11.6–15.4)
WBC: 7.9 x10E3/uL (ref 3.4–10.8)

## 2023-11-28 LAB — BASIC METABOLIC PANEL WITH GFR
BUN/Creatinine Ratio: 16 (ref 10–24)
BUN: 12 mg/dL (ref 8–27)
CO2: 26 mmol/L (ref 20–29)
Calcium: 9.5 mg/dL (ref 8.6–10.2)
Chloride: 96 mmol/L (ref 96–106)
Creatinine, Ser: 0.77 mg/dL (ref 0.76–1.27)
Glucose: 100 mg/dL — ABNORMAL HIGH (ref 70–99)
Potassium: 3.7 mmol/L (ref 3.5–5.2)
Sodium: 139 mmol/L (ref 134–144)
eGFR: 92 mL/min/1.73 (ref >59)

## 2023-12-01 ENCOUNTER — Encounter (HOSPITAL_COMMUNITY): Payer: Self-pay

## 2023-12-15 ENCOUNTER — Encounter: Payer: Self-pay | Admitting: Emergency Medicine

## 2023-12-15 NOTE — Pre-Procedure Instructions (Signed)
 Attempted to call patient regarding procedure instructions.  Left voicemail on the following items: Arrival time 1130 Nothing to eat or drink after midnight No meds AM of procedure Responsible person to drive you home and stay with you for 24 hrs Wash with special soap night before and morning of procedure If on anti-coagulant drug instructions ASA- last dose should have been 12/3

## 2023-12-16 ENCOUNTER — Other Ambulatory Visit: Payer: Self-pay

## 2023-12-16 ENCOUNTER — Ambulatory Visit (HOSPITAL_COMMUNITY): Admission: RE | Disposition: A | Payer: Self-pay | Source: Home / Self Care | Attending: Cardiovascular Disease

## 2023-12-16 ENCOUNTER — Ambulatory Visit (HOSPITAL_COMMUNITY)

## 2023-12-16 ENCOUNTER — Ambulatory Visit (HOSPITAL_COMMUNITY)
Admission: RE | Admit: 2023-12-16 | Discharge: 2023-12-16 | Disposition: A | Attending: Cardiovascular Disease | Admitting: Cardiovascular Disease

## 2023-12-16 DIAGNOSIS — I441 Atrioventricular block, second degree: Secondary | ICD-10-CM

## 2023-12-16 HISTORY — PX: PACEMAKER IMPLANT: EP1218

## 2023-12-16 LAB — GLUCOSE, CAPILLARY: Glucose-Capillary: 136 mg/dL — ABNORMAL HIGH (ref 70–99)

## 2023-12-16 SURGERY — PACEMAKER IMPLANT
Anesthesia: LOCAL

## 2023-12-16 MED ORDER — POVIDONE-IODINE 10 % EX SWAB
2.0000 | Freq: Once | CUTANEOUS | Status: AC
  Administered 2023-12-16: 2 via TOPICAL

## 2023-12-16 MED ORDER — SODIUM CHLORIDE 0.9 % IV SOLN: 80.0000 mg | INTRAVENOUS | Status: AC

## 2023-12-16 MED ORDER — LIDOCAINE HCL (PF) 1 % IJ SOLN
INTRAMUSCULAR | Status: AC
  Filled 2023-12-16: qty 60

## 2023-12-16 MED ORDER — FENTANYL CITRATE (PF) 100 MCG/2ML IJ SOLN
INTRAMUSCULAR | Status: AC
  Filled 2023-12-16: qty 2

## 2023-12-16 MED ORDER — LIDOCAINE HCL (PF) 1 % IJ SOLN
INTRAMUSCULAR | Status: DC | PRN
  Administered 2023-12-16: 30 mL

## 2023-12-16 MED ORDER — SODIUM CHLORIDE 0.9 % IV SOLN: INTRAVENOUS | Status: DC

## 2023-12-16 MED ORDER — ACETAMINOPHEN 325 MG PO TABS: 325.0000 mg | ORAL_TABLET | ORAL | Status: DC | PRN

## 2023-12-16 MED ORDER — MIDAZOLAM HCL 2 MG/2ML IJ SOLN
INTRAMUSCULAR | Status: AC
  Filled 2023-12-16: qty 2

## 2023-12-16 MED ORDER — CHLORHEXIDINE GLUCONATE 4 % EX SOLN
4.0000 | Freq: Once | CUTANEOUS | Status: DC
  Filled 2023-12-16: qty 60

## 2023-12-16 MED ORDER — CEFAZOLIN SODIUM-DEXTROSE 2-4 GM/100ML-% IV SOLN: 2.0000 g | INTRAVENOUS | Status: AC

## 2023-12-16 MED ORDER — FENTANYL CITRATE (PF) 100 MCG/2ML IJ SOLN
INTRAMUSCULAR | Status: DC | PRN
  Administered 2023-12-16 (×2): 25 ug via INTRAVENOUS
  Administered 2023-12-16: 50 ug via INTRAVENOUS

## 2023-12-16 MED ORDER — SODIUM CHLORIDE 0.9 % IV SOLN
INTRAVENOUS | Status: AC
  Administered 2023-12-16: 80 mg
  Filled 2023-12-16: qty 2

## 2023-12-16 MED ORDER — ONDANSETRON HCL 4 MG/2ML IJ SOLN: 4.0000 mg | Freq: Four times a day (QID) | INTRAMUSCULAR | Status: DC | PRN

## 2023-12-16 MED ORDER — MIDAZOLAM HCL 5 MG/5ML IJ SOLN
INTRAMUSCULAR | Status: DC | PRN
  Administered 2023-12-16 (×3): 1 mg via INTRAVENOUS

## 2023-12-16 MED ORDER — CEFAZOLIN SODIUM-DEXTROSE 2-4 GM/100ML-% IV SOLN
INTRAVENOUS | Status: AC
  Administered 2023-12-16: 2 g via INTRAVENOUS
  Filled 2023-12-16: qty 100

## 2023-12-16 SURGICAL SUPPLY — 11 items
CABLE SURGICAL S-101-97-12 (CABLE) ×1 IMPLANT
CATH RIGHTSITE C315HIS02 (CATHETERS) IMPLANT
IPG PACE AZUR XT DR MRI W1DR01 (Pacemaker) IMPLANT
KIT MICROPUNCTURE NIT STIFF (SHEATH) IMPLANT
LEAD CAPSURE NOVUS 5076-52CM (Lead) IMPLANT
LEAD SELECT SECURE 3830 383069 (Lead) IMPLANT
PAD DEFIB RADIO PHYSIO CONN (PAD) ×1 IMPLANT
SHEATH 7FR PRELUDE SNAP 13 (SHEATH) IMPLANT
SLITTER 6232ADJ (MISCELLANEOUS) IMPLANT
TRAY PACEMAKER INSERTION (PACKS) ×1 IMPLANT
WIRE HI TORQ VERSACORE-J 145CM (WIRE) IMPLANT

## 2023-12-16 NOTE — Progress Notes (Signed)
 Pt and son received discharge instructions, teach back performed. IV's removed, no complications. Left chest site is clean dry intact, no signs of bleeding. Pt escorted out via wheelchair to sons vehicle.

## 2023-12-16 NOTE — Discharge Instructions (Signed)
 After Your Pacemaker   You have a Medtronic Pacemaker  If you have a Medtronic or Biotronik device, plug in your home monitor once you get home, and no manual interaction is required.   If you have an [employer] or [employer] device, plug your home monitor once you get home, sit near the device, and press the large activation button. Sit nearby until the process is complete, usually notated by lights on the monitor.   If you were set up for monitoring using an app on your phone, make sure the app remains open in the background and the Bluetooth remains on.  ACTIVITY Do not lift your arm above shoulder height for 1 week after your procedure. After 7 days, you may progress as below.  You should remove your sling 24 hours after your procedure, unless otherwise instructed by your provider.     Tuesday December 23, 2023  Wednesday December 24, 2023 Thursday December 25, 2023 Friday December 26, 2023   Do not lift, push, pull, or carry anything over 10 pounds with the affected arm until 6 weeks (Tuesday January 27, 2024 ) after your procedure.   You may drive AFTER your wound check, unless you have been told otherwise by your provider.   Ask your healthcare provider when you can go back to work   INCISION/Dressing If you are on a blood thinner such as Coumadin, Xarelto, Eliquis, Plavix, or Pradaxa please confirm with your provider when this should be resumed.   If large square, outer bandage is left in place, this can be removed after 24 hours from your procedure. Do not remove steri-strips or glue as below.   If a PRESSURE DRESSING (a bulky dressing that usually goes up over your shoulder) was applied or left in place, please follow instructions given by your provider on when to return to have this removed.   Monitor your Pacemaker site for redness, swelling, and drainage. Call the device clinic at [phone] if you experience these symptoms or fever/chills.  If your incision is  sealed with Steri-strips or staples, you may shower 7 days after your procedure or when told by your provider. Do not remove the steri-strips or let the shower hit directly on your site. You may wash around your site with soap and water.    If you were discharged in a sling, please do not wear this during the day more than 48 hours after your surgery unless otherwise instructed. This may increase the risk of stiffness and soreness in your shoulder.   Avoid lotions, ointments, or perfumes over your incision until it is well-healed.  You may use a hot tub or a pool AFTER your wound check appointment if the incision is completely closed.  Pacemaker Alerts:  Some alerts are vibratory and others beep. These are NOT emergencies. Please call our office to let us  know. If this occurs at night or on weekends, it can wait until the next business day. Send a remote transmission.  If your device is capable of reading fluid status (for heart failure), you will be offered monthly monitoring to review this with you.   DEVICE MANAGEMENT Remote monitoring is used to monitor your pacemaker from home. This monitoring is scheduled every 91 days by our office. It allows us  to keep an eye on the functioning of your device to ensure it is working properly. You will routinely see your Electrophysiologist annually (more often if necessary).  This will appear as a REMOTE check on your  MyChart schedule. These are automatic and there is nothing for you to manually do unless otherwise instructed.  You should receive your ID card for your new device in 4-8 weeks. Keep this card with you at all times once received. Consider wearing a medical alert bracelet or necklace.  Your Pacemaker may be MRI compatible. This will be discussed at your next office visit/wound check.  You should avoid contact with strong electric or magnetic fields.   Do not use amateur (ham) radio equipment or electric (arc) welding torches. MP3 player  headphones with magnets should not be used. Some devices are safe to use if held at least 12 inches (30 cm) from your Pacemaker. These include power tools, lawn mowers, and speakers. If you are unsure if something is safe to use, ask your health care provider.  When using your cell phone, hold it to the ear that is on the opposite side from the Pacemaker. Do not leave your cell phone in a pocket over the Pacemaker.  You may safely use electric blankets, heating pads, computers, and microwave ovens.  Call the office right away if: You have chest pain. You feel more short of breath than you have felt before. You feel more light-headed than you have felt before. Your incision starts to open up.  This information is not intended to replace advice given to you by your health care provider. Make sure you discuss any questions you have with your health care provider.

## 2023-12-16 NOTE — Interval H&P Note (Signed)
 History and Physical Interval Note:  12/16/2023 1:24 PM  Blake Johnston   has presented today for surgery, with the diagnosis of Bradycardia.  The various methods of treatment have been discussed with the patient and family. After consideration of risks, benefits and other options for treatment, the patient has consented to  Procedure(s): PACEMAKER IMPLANT (N/A) as a surgical intervention.  The patient's history has been reviewed, patient examined, no change in status, stable for surgery.  I have reviewed the patient's chart and labs.  Questions were answered to the patient's satisfaction.     Doni Bacha E Lynton Crescenzo

## 2023-12-17 ENCOUNTER — Encounter (HOSPITAL_COMMUNITY): Payer: Self-pay | Admitting: Cardiovascular Disease

## 2023-12-17 MED FILL — Midazolam HCl Inj 2 MG/2ML (Base Equivalent): INTRAMUSCULAR | Qty: 3 | Status: AC

## 2023-12-30 ENCOUNTER — Ambulatory Visit

## 2023-12-30 DIAGNOSIS — I441 Atrioventricular block, second degree: Secondary | ICD-10-CM | POA: Diagnosis not present

## 2023-12-30 LAB — CUP PACEART INCLINIC DEVICE CHECK
Date Time Interrogation Session: 20251223093200
Implantable Lead Connection Status: 753985
Implantable Lead Connection Status: 753985
Implantable Lead Implant Date: 20251209
Implantable Lead Implant Date: 20251209
Implantable Lead Location: 753859
Implantable Lead Location: 753860
Implantable Lead Model: 3830
Implantable Lead Model: 5076
Implantable Pulse Generator Implant Date: 20251209

## 2023-12-30 NOTE — Patient Instructions (Signed)
" ° °  After Your Pacemaker   Monitor your pacemaker site for redness, swelling, and drainage. Call the device clinic at [phone] if you experience these symptoms or fever/chills.  Your incision was closed with Steri-strips or staples:  You may shower 7 days after your procedure and wash your incision with soap and water. Avoid lotions, ointments, or perfumes over your incision until it is well-healed.  You may use a hot tub or a pool after your wound check appointment if the incision is completely closed.  Do not lift, push or pull greater than 10 pounds with the affected arm until JANUARY 20th. There are no other restrictions in arm movement after your wound check appointment.  You may drive, unless driving has been restricted by your healthcare providers.  Remote monitoring is used to monitor your pacemaker from home. This monitoring is scheduled every 91 days by our office. It allows us  to keep an eye on the functioning of your device to ensure it is working properly. You will routinely see your Electrophysiologist annually (more often if necessary).  "

## 2023-12-30 NOTE — Progress Notes (Signed)
 Normal dual chamber pacemaker wound check. 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 Wound well healed. Routine testing performed. Thresholds, sensing, and impedance consistent with implant measurements and at 3.5V safety margin/auto capture until 3 month visit. AT/AF burden 0.2%. AT/AF episodes noted atrial rates c/w AT & PAC's. 2 brief NSVT episodes noted longest 3 seconds. Reviewed arm restrictions to continue for 6 weeks total post op.  Pt enrolled in remote follow-up.

## 2024-01-07 ENCOUNTER — Ambulatory Visit: Payer: Self-pay | Admitting: Cardiovascular Disease

## 2024-01-29 ENCOUNTER — Ambulatory Visit

## 2024-02-05 ENCOUNTER — Telehealth (HOSPITAL_BASED_OUTPATIENT_CLINIC_OR_DEPARTMENT_OTHER): Payer: Self-pay | Admitting: *Deleted

## 2024-02-05 NOTE — Telephone Encounter (Signed)
"  ° °  Pre-operative Risk Assessment    Patient Name: Blake Johnston   DOB: 01-08-1945 MRN: [id number]   Date of last office visit: 11/27/23 DAPHNE BARRACK, NP Date of next office visit: 03/16/24 JODIE PASSEY, [facility]   Request for Surgical Clearance    Procedure:  LEFT CATARACT EXTRACTION  Date of Surgery:  Clearance 03/03/24                                Surgeon:  DR. OZELL SAR Surgeon's Group or Practice Name:  [facility]  Phone number:  [phone] Fax number:  [phone]   Type of Clearance Requested:   - Medical  NON MEDICATIONS LISTED ON FORM AS NEEDING TO BE HELD   Type of Anesthesia:  REGIONAL   Additional requests/questions:    Blake Johnston   02/05/2024, 3:56 PM   "

## 2024-02-06 ENCOUNTER — Telehealth: Payer: Self-pay

## 2024-02-06 NOTE — Telephone Encounter (Signed)
"  ° °  Patient Name: Blake Johnston   DOB: 10/19/1945 MRN: [id number]  Primary Cardiologist: Lamar Fitch, MD  Chart reviewed as part of pre-operative protocol coverage. Cataract extractions are recognized in guidelines as low risk surgeries that do not typically require specific preoperative testing or holding of blood thinner therapy. Therefore, given past medical history and time since last visit, based on ACC/AHA guidelines, Blake Johnston  would be at acceptable risk for the planned procedure without further cardiovascular testing.   Patient had recent pacemaker implantation by Dr. Nancey on 12/16/2023. He will need to have recommendations from the device clinic as well.  I will route this recommendation to the requesting party via Epic fax function and remove from pre-op pool.  Please call with questions.  [name], NP 02/06/2024, 7:04 AM  "

## 2024-02-06 NOTE — Telephone Encounter (Signed)
 Device clinic,  Please provide recommendations for patient's upcoming cataract procedure.  He was post recent PPM by Dr. Nancey on 12/16/2023.  Thank you for your help.  Josefa HERO. Michole Lecuyer NP-C     02/06/2024, 7:10 AM [facility] [address] [place], [place] [address] Office [phone]

## 2024-02-06 NOTE — Telephone Encounter (Signed)
 Called pt lvm for pt to send transmission that he has missed

## 2024-02-09 ENCOUNTER — Encounter: Payer: Self-pay | Admitting: Cardiovascular Disease

## 2024-03-16 ENCOUNTER — Ambulatory Visit: Admitting: Student

## 2024-04-29 ENCOUNTER — Ambulatory Visit

## 2024-07-29 ENCOUNTER — Ambulatory Visit

## 2024-10-28 ENCOUNTER — Ambulatory Visit

## 2025-01-27 ENCOUNTER — Ambulatory Visit
